# Patient Record
Sex: Female | Born: 1955
Health system: Southern US, Community
[De-identification: ages and names within clinical notes are randomized; demographics above are authoritative.]

## PROBLEM LIST (undated history)

## (undated) DIAGNOSIS — E7800 Pure hypercholesterolemia, unspecified: Secondary | ICD-10-CM

## (undated) DIAGNOSIS — I10 Essential (primary) hypertension: Secondary | ICD-10-CM

## (undated) DIAGNOSIS — I214 Non-ST elevation (NSTEMI) myocardial infarction: Secondary | ICD-10-CM

## (undated) DIAGNOSIS — E785 Hyperlipidemia, unspecified: Secondary | ICD-10-CM

## (undated) HISTORY — DX: Non-ST elevation (NSTEMI) myocardial infarction: I21.4

## (undated) HISTORY — DX: Essential (primary) hypertension: I10

## (undated) HISTORY — PX: TONSILLECTOMY: SUR1361

## (undated) HISTORY — DX: Hyperlipidemia, unspecified: E78.5

---

## 2000-11-17 ENCOUNTER — Other Ambulatory Visit: Admission: RE | Admit: 2000-11-17 | Discharge: 2000-11-17 | Payer: Self-pay | Admitting: Gynecology

## 2002-02-01 ENCOUNTER — Other Ambulatory Visit: Admission: RE | Admit: 2002-02-01 | Discharge: 2002-02-01 | Payer: Self-pay | Admitting: Gynecology

## 2003-02-07 ENCOUNTER — Other Ambulatory Visit: Admission: RE | Admit: 2003-02-07 | Discharge: 2003-02-07 | Payer: Self-pay | Admitting: Gynecology

## 2003-02-16 ENCOUNTER — Encounter: Payer: Self-pay | Admitting: Gynecology

## 2003-02-16 ENCOUNTER — Encounter: Admission: RE | Admit: 2003-02-16 | Discharge: 2003-02-16 | Payer: Self-pay | Admitting: Gynecology

## 2004-03-05 ENCOUNTER — Other Ambulatory Visit: Admission: RE | Admit: 2004-03-05 | Discharge: 2004-03-05 | Payer: Self-pay | Admitting: Gynecology

## 2004-04-04 ENCOUNTER — Encounter: Admission: RE | Admit: 2004-04-04 | Discharge: 2004-04-04 | Payer: Self-pay | Admitting: Gynecology

## 2005-03-06 ENCOUNTER — Other Ambulatory Visit: Admission: RE | Admit: 2005-03-06 | Discharge: 2005-03-06 | Payer: Self-pay | Admitting: Gynecology

## 2005-09-09 ENCOUNTER — Other Ambulatory Visit: Admission: RE | Admit: 2005-09-09 | Discharge: 2005-09-09 | Payer: Self-pay | Admitting: Gynecology

## 2005-10-07 ENCOUNTER — Encounter: Admission: RE | Admit: 2005-10-07 | Discharge: 2005-10-07 | Payer: Self-pay | Admitting: Gynecology

## 2005-10-22 ENCOUNTER — Encounter: Admission: RE | Admit: 2005-10-22 | Discharge: 2005-10-22 | Payer: Self-pay | Admitting: Gynecology

## 2006-03-11 ENCOUNTER — Other Ambulatory Visit: Admission: RE | Admit: 2006-03-11 | Discharge: 2006-03-11 | Payer: Self-pay | Admitting: Gynecology

## 2006-11-13 ENCOUNTER — Encounter: Admission: RE | Admit: 2006-11-13 | Discharge: 2006-11-13 | Payer: Self-pay | Admitting: Gynecology

## 2008-01-04 ENCOUNTER — Encounter: Admission: RE | Admit: 2008-01-04 | Discharge: 2008-01-04 | Payer: Self-pay | Admitting: Gynecology

## 2009-01-16 ENCOUNTER — Encounter: Admission: RE | Admit: 2009-01-16 | Discharge: 2009-01-16 | Payer: Self-pay | Admitting: Gynecology

## 2010-02-05 ENCOUNTER — Encounter: Admission: RE | Admit: 2010-02-05 | Discharge: 2010-02-05 | Payer: Self-pay | Admitting: Gynecology

## 2011-01-18 ENCOUNTER — Other Ambulatory Visit: Payer: Self-pay | Admitting: Gynecology

## 2011-01-18 DIAGNOSIS — Z1231 Encounter for screening mammogram for malignant neoplasm of breast: Secondary | ICD-10-CM

## 2011-02-11 ENCOUNTER — Ambulatory Visit
Admission: RE | Admit: 2011-02-11 | Discharge: 2011-02-11 | Disposition: A | Discharge status: Home / Self Care | Payer: BC Managed Care – PPO | Source: Ambulatory Visit | Attending: Gynecology | Admitting: Gynecology

## 2011-02-11 DIAGNOSIS — Z1231 Encounter for screening mammogram for malignant neoplasm of breast: Secondary | ICD-10-CM

## 2011-02-19 ENCOUNTER — Other Ambulatory Visit: Payer: Self-pay | Admitting: Gynecology

## 2011-02-19 DIAGNOSIS — R928 Other abnormal and inconclusive findings on diagnostic imaging of breast: Secondary | ICD-10-CM

## 2011-03-01 ENCOUNTER — Ambulatory Visit
Admission: RE | Admit: 2011-03-01 | Discharge: 2011-03-01 | Disposition: A | Discharge status: Home / Self Care | Payer: BC Managed Care – PPO | Source: Ambulatory Visit | Attending: Gynecology | Admitting: Gynecology

## 2011-03-01 DIAGNOSIS — R928 Other abnormal and inconclusive findings on diagnostic imaging of breast: Secondary | ICD-10-CM

## 2011-12-30 ENCOUNTER — Encounter (HOSPITAL_BASED_OUTPATIENT_CLINIC_OR_DEPARTMENT_OTHER): Payer: Self-pay | Admitting: *Deleted

## 2011-12-30 NOTE — Progress Notes (Signed)
To [facility] at  [phone], Ekg on arrival Npo after mn.

## 2012-01-03 ENCOUNTER — Encounter (HOSPITAL_BASED_OUTPATIENT_CLINIC_OR_DEPARTMENT_OTHER): Payer: Self-pay | Admitting: *Deleted

## 2012-01-03 ENCOUNTER — Encounter (HOSPITAL_BASED_OUTPATIENT_CLINIC_OR_DEPARTMENT_OTHER): Payer: Self-pay | Admitting: Anesthesiology

## 2012-01-03 ENCOUNTER — Other Ambulatory Visit: Payer: Self-pay

## 2012-01-03 ENCOUNTER — Encounter (HOSPITAL_BASED_OUTPATIENT_CLINIC_OR_DEPARTMENT_OTHER)
Admission: RE | Disposition: A | Discharge status: Home / Self Care | Payer: Self-pay | Source: Ambulatory Visit | Attending: Gynecology

## 2012-01-03 ENCOUNTER — Ambulatory Visit (HOSPITAL_BASED_OUTPATIENT_CLINIC_OR_DEPARTMENT_OTHER): Payer: BC Managed Care – PPO | Admitting: Anesthesiology

## 2012-01-03 ENCOUNTER — Ambulatory Visit (HOSPITAL_BASED_OUTPATIENT_CLINIC_OR_DEPARTMENT_OTHER)
Admission: RE | Admit: 2012-01-03 | Discharge: 2012-01-03 | Disposition: A | Discharge status: Home / Self Care | Payer: BC Managed Care – PPO | Source: Ambulatory Visit | Attending: Gynecology | Admitting: Gynecology

## 2012-01-03 DIAGNOSIS — N8000 Endometriosis of the uterus, unspecified: Secondary | ICD-10-CM | POA: Insufficient documentation

## 2012-01-03 DIAGNOSIS — N950 Postmenopausal bleeding: Secondary | ICD-10-CM | POA: Insufficient documentation

## 2012-01-03 HISTORY — DX: Essential (primary) hypertension: I10

## 2012-01-03 HISTORY — DX: Pure hypercholesterolemia, unspecified: E78.00

## 2012-01-03 HISTORY — PX: HYSTEROSCOPY WITH D & C: SHX1775

## 2012-01-03 LAB — POCT I-STAT, CHEM 8
BUN: 15 mg/dL (ref 6–23)
Calcium, Ion: 1.28 mmol/L — ABNORMAL HIGH (ref 1.12–1.23)
TCO2: 26 mmol/L (ref 0–100)

## 2012-01-03 SURGERY — DILATATION AND CURETTAGE /HYSTEROSCOPY
Anesthesia: General | Site: Uterus | Wound class: Clean Contaminated

## 2012-01-03 MED ORDER — OXYCODONE-ACETAMINOPHEN 5-325 MG PO TABS: 1.0000 | ORAL_TABLET | ORAL | Status: DC | PRN

## 2012-01-03 MED ORDER — PROMETHAZINE HCL 25 MG RE SUPP
25.0000 mg | Freq: Four times a day (QID) | RECTAL | Status: DC | PRN
  Administered 2012-01-03: 25 mg via RECTAL

## 2012-01-03 MED ORDER — LACTATED RINGERS IV SOLN
INTRAVENOUS | Status: DC
  Administered 2012-01-03 (×2): via INTRAVENOUS

## 2012-01-03 MED ORDER — LIDOCAINE HCL (CARDIAC) 20 MG/ML IV SOLN
INTRAVENOUS | Status: DC | PRN
  Administered 2012-01-03: 50 mg via INTRAVENOUS

## 2012-01-03 MED ORDER — KETOROLAC TROMETHAMINE 30 MG/ML IJ SOLN
30.0000 mg | Freq: Once | INTRAMUSCULAR | Status: AC
  Administered 2012-01-03: 30 mg via INTRAVENOUS

## 2012-01-03 MED ORDER — LIDOCAINE HCL (PF) 1 % IJ SOLN
INTRAMUSCULAR | Status: DC | PRN
  Administered 2012-01-03: 10 mL

## 2012-01-03 MED ORDER — HYDRALAZINE HCL 20 MG/ML IJ SOLN
INTRAMUSCULAR | Status: DC | PRN
  Administered 2012-01-03 (×3): 5 mg via INTRAVENOUS

## 2012-01-03 MED ORDER — PROMETHAZINE HCL 25 MG/ML IJ SOLN
6.2500 mg | INTRAMUSCULAR | Status: DC | PRN
  Administered 2012-01-03: 6.25 mg via INTRAVENOUS

## 2012-01-03 MED ORDER — KETOROLAC TROMETHAMINE 30 MG/ML IJ SOLN
INTRAMUSCULAR | Status: DC | PRN
  Administered 2012-01-03: 30 mg via INTRAVENOUS

## 2012-01-03 MED ORDER — FENTANYL CITRATE 0.05 MG/ML IJ SOLN
INTRAMUSCULAR | Status: DC | PRN
  Administered 2012-01-03: 50 ug via INTRAVENOUS
  Administered 2012-01-03 (×2): 25 ug via INTRAVENOUS

## 2012-01-03 MED ORDER — MIDAZOLAM HCL 5 MG/5ML IJ SOLN
INTRAMUSCULAR | Status: DC | PRN
  Administered 2012-01-03: 2 mg via INTRAVENOUS

## 2012-01-03 MED ORDER — FENTANYL CITRATE 0.05 MG/ML IJ SOLN
25.0000 ug | INTRAMUSCULAR | Status: DC | PRN
  Administered 2012-01-03 (×3): 25 ug via INTRAVENOUS

## 2012-01-03 MED ORDER — GLYCINE 1.5 % IR SOLN
Status: DC | PRN
  Administered 2012-01-03: 2

## 2012-01-03 MED ORDER — PROPOFOL INFUSION 10 MG/ML OPTIME
INTRAVENOUS | Status: DC | PRN
  Administered 2012-01-03: 50 ug/kg/min via INTRAVENOUS

## 2012-01-03 MED ORDER — LACTATED RINGERS IV SOLN: INTRAVENOUS | Status: DC

## 2012-01-03 SURGICAL SUPPLY — 29 items
CANISTER SUCTION 2500CC (MISCELLANEOUS) ×2 IMPLANT
CATH ROBINSON RED A/P 16FR (CATHETERS) ×1 IMPLANT
CLOTH BEACON ORANGE TIMEOUT ST (SAFETY) ×2 IMPLANT
CORD ACTIVE DISPOSABLE (ELECTRODE) ×1
CORD ELECTRO ACTIVE DISP (ELECTRODE) ×1 IMPLANT
COVER TABLE BACK 60X90 (DRAPES) ×2 IMPLANT
DRAPE CAMERA CLOSED 9X96 (DRAPES) ×2 IMPLANT
DRAPE LG THREE QUARTER DISP (DRAPES) ×2 IMPLANT
ELECT LOOP GYNE PRO 24FR (CUTTING LOOP) ×4
ELECT REM PT RETURN 9FT ADLT (ELECTROSURGICAL) ×2
ELECT VAPORTRODE GRVD BAR (ELECTRODE) IMPLANT
ELECTRODE LOOP GYNE PRO 24FR (CUTTING LOOP) ×1 IMPLANT
ELECTRODE REM PT RTRN 9FT ADLT (ELECTROSURGICAL) ×1 IMPLANT
GLOVE BIO SURGEON STRL SZ8 (GLOVE) ×4 IMPLANT
GLOVE INDICATOR 7.0 STRL GRN (GLOVE) ×1 IMPLANT
GLYCINE 1.5% IRRIG UROMATIC (IV SOLUTION) ×2 IMPLANT
GOWN STRL NON-REIN LRG LVL3 (GOWN DISPOSABLE) ×2 IMPLANT
GOWN STRL REIN XL XLG (GOWN DISPOSABLE) ×2 IMPLANT
LEGGING LITHOTOMY PAIR STRL (DRAPES) ×2 IMPLANT
NDL SPNL 22GX3.5 QUINCKE BK (NEEDLE) ×1 IMPLANT
NEEDLE SPNL 22GX3.5 QUINCKE BK (NEEDLE) ×2 IMPLANT
PACK BASIN DAY SURGERY FS (CUSTOM PROCEDURE TRAY) ×2 IMPLANT
PAD OB MATERNITY 4.3X12.25 (PERSONAL CARE ITEMS) ×2 IMPLANT
PAD PREP 24X48 CUFFED NSTRL (MISCELLANEOUS) ×2 IMPLANT
SYR CONTROL 10ML LL (SYRINGE) ×1 IMPLANT
TOWEL OR 17X24 6PK STRL BLUE (TOWEL DISPOSABLE) ×1 IMPLANT
TRAY DSU PREP LF (CUSTOM PROCEDURE TRAY) ×2 IMPLANT
TUBING HYDROFLEX HYSTEROSCOPY (TUBING) ×2 IMPLANT
WATER STERILE IRR 500ML POUR (IV SOLUTION) ×2 IMPLANT

## 2012-01-03 NOTE — Anesthesia Preprocedure Evaluation (Addendum)
Anesthesia Evaluation  Patient identified by MRN, date of birth, ID band Patient awake    Reviewed: Allergy & Precautions, H&P , NPO status , Patient's Chart, lab work & pertinent test results  Airway Mallampati: II TM Distance: >3 FB Neck ROM: Full    Dental  (+) Teeth Intact and Dental Advisory Given   Pulmonary neg pulmonary ROS,  breath sounds clear to auscultation  Pulmonary exam normal       Cardiovascular hypertension, Pt. on medications Rhythm:Regular Rate:Normal     Neuro/Psych negative neurological ROS  negative psych ROS   GI/Hepatic negative GI ROS, Neg liver ROS,   Endo/Other  negative endocrine ROS  Renal/GU negative Renal ROS  negative genitourinary   Musculoskeletal negative musculoskeletal ROS (+)   Abdominal   Peds  Hematology negative hematology ROS (+)   Anesthesia Other Findings   Reproductive/Obstetrics negative OB ROS                           Anesthesia Physical Anesthesia Plan  ASA: II  Anesthesia Plan: General   Post-op Pain Management:    Induction: Intravenous  Airway Management Planned: LMA  Additional Equipment:   Intra-op Plan:   Post-operative Plan: Extubation in OR  Informed Consent: I have reviewed the patients History and Physical, chart, labs and discussed the procedure including the risks, benefits and alternatives for the proposed anesthesia with the patient or authorized representative who has indicated his/her understanding and acceptance.   Dental advisory given  Plan Discussed with: CRNA  Anesthesia Plan Comments:         Anesthesia Quick Evaluation  

## 2012-01-03 NOTE — Anesthesia Postprocedure Evaluation (Signed)
Anesthesia Post Note  Patient: Kristin Stevenson  Procedure(s) Performed: Procedure(s) (LRB): DILATATION AND CURETTAGE /HYSTEROSCOPY (N/A)  Anesthesia type: MAC  Patient location: PACU  Post pain: Pain level controlled  Post assessment: Post-op Vital signs reviewed  Last Vitals:  Filed Vitals:   01/03/12 0957  BP: 153/67  Pulse: 89  Temp:   Resp: 18    Post vital signs: Reviewed  Level of consciousness: sedated  Complications: No apparent anesthesia complications

## 2012-01-03 NOTE — Transfer of Care (Signed)
Immediate Anesthesia Transfer of Care Note  Patient: Kristin Stevenson  Procedure(s) Performed: Procedure(s) (LRB): DILATATION AND CURETTAGE /HYSTEROSCOPY (N/A)  Patient Location: PACU  Anesthesia Type: MAC  Level of Consciousness: awake, alert , oriented and patient cooperative  Airway & Oxygen Therapy: Patient Spontanous Breathing and Patient connected to face mask oxygen  Post-op Assessment: Report given to PACU RN and Post -op Vital signs reviewed and stable  Post vital signs: Reviewed and stable  Complications: No apparent anesthesia complications

## 2012-01-07 ENCOUNTER — Encounter (HOSPITAL_BASED_OUTPATIENT_CLINIC_OR_DEPARTMENT_OTHER): Payer: Self-pay | Admitting: Gynecology

## 2012-01-07 NOTE — Op Note (Signed)
Kristin Stevenson, Kristin Stevenson                ACCOUNT NO.:  [id number]  MEDICAL RECORD NO.:  [id number]  LOCATION:                                 FACILITY:  PHYSICIAN:  Gretta Cool, M.D. DATE OF BIRTH:  [birth year]  DATE OF PROCEDURE:  01/03/2012 DATE OF DISCHARGE:                              OPERATIVE REPORT   PREOPERATIVE DIAGNOSES:  Postmenopausal bleeding with abnormal endometrial thickening and saline ultrasound suspicious of endometrial polyp versus fibroid bulging into the cavity.  POSTOPERATIVE DIAGNOSIS:  Postmenopausal bleeding with abnormal endometrial thickening and saline ultrasound suspicious of endometrial polyp versus fibroid bulging into the cavity.  PROCEDURE:  Hysteroscopy, resection of polypoid endometrial tissue.  SURGEON:  Gretta Cool, M.D.  ANESTHESIA:  IV sedation and paracervical block.  DESCRIPTION OF PROCEDURE:  Under excellent anesthesia as above, with the patient prepped and draped in Allen stirrups.  Bladder drained. Weighted speculum inserted in the vagina.  The cervix was grasped and a paracervical block applied.  After excellent paracervical block, the cervix progressively dilated with series of Pratt dilators to accommodate 7-mm resectoscope.  Resectoscope was then introduced and the cavity examined.  There was no evidence of fibroid, but there was a very shaggy endometrial tissue in the fundal area.  The tissue was resected totally and submitted for pathologic exam.  Once all of the endometrial tissue was removed, insofar as any significant hyperplastic looking areas, the bleeding was well-controlled at reduced pressure.  The patient was then returned to the recovery room in excellent condition with no significant fluid deficit and no complications.          ______________________________ Gretta Cool, M.D.     CWL/MEDQ  D:  01/03/2012  T:  01/04/2012  Job:  [id number]  cc:   Jani Files

## 2012-06-08 ENCOUNTER — Other Ambulatory Visit: Payer: Self-pay | Admitting: Gynecology

## 2014-11-14 ENCOUNTER — Encounter: Payer: Self-pay | Admitting: Podiatry

## 2014-11-14 ENCOUNTER — Ambulatory Visit: Payer: No Typology Code available for payment source | Admitting: Podiatry

## 2014-11-14 ENCOUNTER — Ambulatory Visit (INDEPENDENT_AMBULATORY_CARE_PROVIDER_SITE_OTHER): Payer: No Typology Code available for payment source

## 2014-11-14 VITALS — BP 160/115 | HR 95 | Resp 18

## 2014-11-14 DIAGNOSIS — M722 Plantar fascial fibromatosis: Secondary | ICD-10-CM

## 2014-11-14 NOTE — Progress Notes (Signed)
   Subjective:    Patient ID: Kristin Stevenson, female    DOB: 04/08/1956, 59 y.o.   MRN: [id number]  HPI MY LEFT HEEL FEELS LIKE A STONE BRUISE AND HAS BEEN GOING ON SINCE April 2016 AND I DID HAVE BURSITIS IN MY LEFT HIP AND I ASKED MY FAMILY DOCTOR AND SHE STATED IT COULD BE MY ACHILLES TENDON AND HURTS ON THE BOTTOM AND BACK OF MY LEFT HEEL AND HURTS AFTER I SIT AND HURTS IN THE AM This patient presents with pain in her left heel and she points to the bottom of her left feel.  She gives history of hip pain requiring therapy.  She was given stretching exercises for her foot but the pain persists.  She says she experiences stiffness to the bottom of heel upon rising and pain after a days activity. She says her pain is about a five and she wants this to be better before her vacation.   Review of Systems  All other systems reviewed and are negative.      Objective:   Physical Exam Objective: Review of past medical history, medications, social history and allergies were performed.  Vascular: Dorsalis pedis and posterior tibial pulses were palpable B/L, capillary refill was  WNL B/L, temperature gradient was WNL B/L   Skin:  No signs of symptoms of infection or ulcers on both feet  Nails: appear healthy with no signs of mycosis or infections  Sensory: Semmes Weinstein monifilament WNL   Orthopedic: Orthopedic evaluation demonstrates all joints distal t ankle have full ROM without crepitus, muscle power WNL B/L.  Palpable pain at insertion plantar fascia left heel.        Assessment & Plan:  Plantar Fascitis left foot.  IE.  X-ray taken.  Prescribed Mobic.  Powersteps dispensed.

## 2014-11-15 MED ORDER — MELOXICAM 15 MG PO TABS
15.0000 mg | ORAL_TABLET | Freq: Every day | ORAL | Status: DC
Start: 2014-11-15 — End: 2017-02-27

## 2014-11-15 NOTE — Addendum Note (Signed)
Addended by: Cranford Mon R on: 11/15/2014 08:19 AM   Modules accepted: Orders

## 2015-01-31 DIAGNOSIS — I10 Essential (primary) hypertension: Secondary | ICD-10-CM

## 2015-01-31 DIAGNOSIS — E785 Hyperlipidemia, unspecified: Secondary | ICD-10-CM | POA: Insufficient documentation

## 2015-01-31 DIAGNOSIS — I214 Non-ST elevation (NSTEMI) myocardial infarction: Secondary | ICD-10-CM | POA: Insufficient documentation

## 2015-01-31 HISTORY — DX: Hyperlipidemia, unspecified: E78.5

## 2015-01-31 HISTORY — DX: Essential (primary) hypertension: I10

## 2015-01-31 HISTORY — DX: Non-ST elevation (NSTEMI) myocardial infarction: I21.4

## 2016-05-27 HISTORY — PX: COLONOSCOPY: SHX174

## 2016-07-31 DIAGNOSIS — N951 Menopausal and female climacteric states: Secondary | ICD-10-CM

## 2016-07-31 HISTORY — DX: Menopausal and female climacteric states: N95.1

## 2017-02-25 ENCOUNTER — Other Ambulatory Visit: Payer: Self-pay | Admitting: *Deleted

## 2017-02-27 ENCOUNTER — Encounter: Payer: Self-pay | Admitting: Cardiology

## 2017-02-27 ENCOUNTER — Ambulatory Visit (INDEPENDENT_AMBULATORY_CARE_PROVIDER_SITE_OTHER): Payer: BLUE CROSS/BLUE SHIELD | Admitting: Cardiology

## 2017-02-27 VITALS — BP 120/70 | HR 101 | Ht 64.0 in | Wt 188.0 lb

## 2017-02-27 DIAGNOSIS — I201 Angina pectoris with documented spasm: Secondary | ICD-10-CM | POA: Diagnosis not present

## 2017-02-27 DIAGNOSIS — I10 Essential (primary) hypertension: Secondary | ICD-10-CM

## 2017-02-27 DIAGNOSIS — E785 Hyperlipidemia, unspecified: Secondary | ICD-10-CM | POA: Diagnosis not present

## 2017-02-27 HISTORY — DX: Angina pectoris with documented spasm: I20.1

## 2017-02-27 MED ORDER — NITROGLYCERIN 0.4 MG SL SUBL: 0.4000 mg | SUBLINGUAL_TABLET | SUBLINGUAL | 2 refills | Status: DC | PRN

## 2017-02-27 MED ORDER — AMLODIPINE BESYLATE 5 MG PO TABS: 5.0000 mg | ORAL_TABLET | Freq: Every day | ORAL | 3 refills | Status: DC

## 2017-02-27 NOTE — Progress Notes (Signed)
Cardiology Office Note:    Date:  02/27/2017   ID:  Kristin Stevenson, DOB 08-Oct-1955, MRN [id number]  PCP:  Marco Collie, MD  Cardiologist:  Shirlee More, MD    Referring MD: Marco Collie, MD    ASSESSMENT:    1. Coronary artery vasospasm ([place])   2. Essential hypertension   3. Dyslipidemia    PLAN:    In order of problems listed above:  1. Stable she is not having typical angina and has not required nitroglycerin statin and long-acting calcium channel blocker.  At this time I do not think she requires an ischemia evaluation.  If having frequent angina she will contact me  2          Stable blood pressure at target continue current combination ACE long-acting calcium channel blocker 3            stable continue her statin recent lipids and CMP requested   Next appointment: One year   Medication Adjustments/Labs and Tests Ordered: Current medicines are reviewed at length with the patient today.  Concerns regarding medicines are outlined above.  Orders Placed This Encounter  Procedures  . EKG 12-Lead   No orders of the defined types were placed in this encounter.   Chief Complaint  Patient presents with  . Follow-up    doing well   . Annual Exam    History of Present Illness:    Kristin Stevenson is a 61 y.o. female with a hx of coronary vasospasm andNSTEMI in Oct 2015 with normal coronary arteriography last seen one year ago. Compliance with diet, lifestyle and medications: Yes She is pleased with the quality of her life at times has fleeting very brief chest discomfort but has not had angina or required nitroglycerin.  Is been no shortness of breath palpitation syncope or TIA. Past Medical History:  Diagnosis Date  . Dyslipidemia 01/31/2015  . Essential hypertension 01/31/2015  . Hypercholesteremia   . Hypertension   . Non-STEMI (non-ST elevated myocardial infarction) ([place]) 01/31/2015   Overview:  Cardiac cath 02/19/14 with normal coronary arteriography mild apical  hypokinesia EF 60%    Past Surgical History:  Procedure Laterality Date  . CESAREAN SECTION  1981  . HYSTEROSCOPY W/D&C  01/03/2012   Procedure: DILATATION AND CURETTAGE /HYSTEROSCOPY;  Surgeon: Selinda Orion, MD;  Location: [facility];  Service: Gynecology;  Laterality: N/A;  WITH RESECTION OF FIBROIDS     . REPEAT CESAREAN SECTION  1983  . TONSILLECTOMY      Current Medications: Current Meds  Medication Sig  . amLODipine (NORVASC) 5 MG tablet TK 1 T PO  QD  . aspirin 81 MG tablet Take 81 mg by mouth daily.  Marland Kitchen BRISDELLE 7.5 MG CAPS Take 7.5 mg by mouth daily.   . Cholecalciferol (VITAMIN D-1000 MAX ST) 1000 units tablet Take 1,000 Units by mouth daily.   Marland Kitchen Co-Enzyme Q-10 30 MG CAPS Take 30 mg by mouth daily.  . DOCOSAHEXAENOIC ACID PO Take 1,000 mg by mouth daily.   Marland Kitchen estradiol (ESTRACE) 0.1 MG/GM vaginal cream Place 1 Applicatorful vaginally daily as needed.  Marland Kitchen lisinopril (PRINIVIL,ZESTRIL) 20 MG tablet Take 20 mg by mouth.  . Multiple Vitamin (MULTIVITAMIN) tablet Take 1 tablet by mouth daily.  . nitroGLYCERIN (NITROSTAT) 0.4 MG SL tablet Place 0.4 mg under the tongue every 5 (five) hours as needed.  . simvastatin (ZOCOR) 20 MG tablet Take 20 mg by mouth every evening.     Allergies:  Patient has no known allergies.   Social History   Social History  . Marital status: Unknown    Spouse name: N/A  . Number of children: N/A  . Years of education: N/A   Social History Main Topics  . Smoking status: Never Smoker  . Smokeless tobacco: Never Used  . Alcohol use Yes     Comment: occasional  . Drug use: No  . Sexual activity: Not Asked   Other Topics Concern  . None   Social History Narrative  . None     Family History: The patient's family history includes Diabetes in her mother; Hypertension in her mother and paternal uncle. ROS:   Please see the history of present illness.    All other systems reviewed and are negative.  EKGs/Labs/Other  Studies Reviewed:    The following studies were reviewed today:  EKG:  EKG ordered today.  The ekg ordered today demonstrates [place] and is normal  Recent Labs: No results found for requested labs within last 8760 hours.  Recent Lipid Panel No results found for: CHOL, TRIG, HDL, CHOLHDL, VLDL, LDLCALC, LDLDIRECT  Physical Exam:    VS:  BP 120/70   Pulse (!) 101   Ht 5\' 4"  (1.626 m)   Wt 188 lb (85.3 kg)   SpO2 96%   BMI 32.27 kg/m     Wt Readings from Last 3 Encounters:  02/27/17 188 lb (85.3 kg)  01/03/12 160 lb (72.6 kg)     GEN:  Well nourished, well developed in no acute distress HEENT: Normal NECK: No JVD; No carotid bruits LYMPHATICS: No lymphadenopathy CARDIAC: RRR, no murmurs, rubs, gallops RESPIRATORY:  Clear to auscultation without rales, wheezing or rhonchi  ABDOMEN: Soft, non-tender, non-distended MUSCULOSKELETAL:  No edema; No deformity  SKIN: Warm and dry NEUROLOGIC:  Alert and oriented x 3 PSYCHIATRIC:  Normal affect    Signed, Shirlee More, MD  02/27/2017 4:38 PM    [facility]

## 2017-02-27 NOTE — Patient Instructions (Signed)

## 2018-02-24 NOTE — Progress Notes (Signed)
Cardiology Office Note:    Date:  02/25/2018   ID:  Kristin Stevenson, DOB [birth year], MRN [id number]  PCP:  Marco Collie, MD  Cardiologist:  Shirlee More, MD    Referring MD: Marco Collie, MD    ASSESSMENT:    1. Coronary artery vasospasm ([place])   2. Essential hypertension   3. Dyslipidemia    PLAN:    In order of problems listed above:  1. Her course is stable rarely she gets brief chest discomfort relieved with nitroglycerin.  Is not exertional is related to work and stress in her life and she will continue current medical treatment aspirin calcium channel blocker statin and she has frequent episodes we could place her on oral Imdur. 2. Stable blood pressure target continue current treatment calcium channel blockers ideal 3. Stable continue her statin.  Last lipid profile AC from February of this year had a LDL of 63 HDL 74 cholesterol 173   Next appointment: One year   Medication Adjustments/Labs and Tests Ordered: Current medicines are reviewed at length with the patient today.  Concerns regarding medicines are outlined above.  No orders of the defined types were placed in this encounter.  No orders of the defined types were placed in this encounter.   Chief Complaint  Patient presents with  . Follow-up    coronary artery spasm    History of Present Illness:    Kristin Stevenson is a 62 y.o. female with a hx of  coronary vasospasm andNSTEMI in Oct 2015 with normal coronary arteriography  last seen 02/27/17. Compliance with diet, lifestyle and medications: Yes  In general has done well has rare episodes of brief chest discomfort relieved with the nitroglycerin is not exertional in nature.  She is in a lifestyle program exercise and weight loss and is pleased.  No shortness of breath palpitation or syncope Past Medical History:  Diagnosis Date  . Dyslipidemia 01/31/2015  . Essential hypertension 01/31/2015  . Hypercholesteremia   . Hypertension   . Non-STEMI (non-ST  elevated myocardial infarction) ([place]) 01/31/2015   Overview:  Cardiac cath 02/19/14 with normal coronary arteriography mild apical hypokinesia EF 60%    Past Surgical History:  Procedure Laterality Date  . CESAREAN SECTION  1981  . HYSTEROSCOPY W/D&C  01/03/2012   Procedure: DILATATION AND CURETTAGE /HYSTEROSCOPY;  Surgeon: Selinda Orion, MD;  Location: [facility];  Service: Gynecology;  Laterality: N/A;  WITH RESECTION OF FIBROIDS     . REPEAT CESAREAN SECTION  1983  . TONSILLECTOMY      Current Medications: Current Meds  Medication Sig  . amLODipine (NORVASC) 5 MG tablet Take 1 tablet (5 mg total) by mouth daily.  Marland Kitchen aspirin 81 MG tablet Take 81 mg by mouth daily.  Marland Kitchen BRISDELLE 7.5 MG CAPS Take 7.5 mg by mouth daily.   . Cholecalciferol (VITAMIN D-1000 MAX ST) 1000 units tablet Take 1,000 Units by mouth daily.   Marland Kitchen Co-Enzyme Q-10 100 MG CAPS Take 100 mg by mouth daily.   . DOCOSAHEXAENOIC ACID PO Take 1,000 mg by mouth daily.   Marland Kitchen estradiol (ESTRACE) 0.1 MG/GM vaginal cream Place 1 Applicatorful vaginally daily as needed.  Marland Kitchen lisinopril (PRINIVIL,ZESTRIL) 20 MG tablet Take 20 mg by mouth.  . Multiple Vitamin (MULTIVITAMIN) tablet Take 1 tablet by mouth daily.  . nitroGLYCERIN (NITROSTAT) 0.4 MG SL tablet Place 1 tablet (0.4 mg total) under the tongue every 5 (five) minutes as needed.  . simvastatin (ZOCOR) 20 MG tablet Take 20  mg by mouth every evening.     Allergies:   Patient has no known allergies.   Social History   Socioeconomic History  . Marital status: Unknown    Spouse name: Not on file  . Number of children: Not on file  . Years of education: Not on file  . Highest education level: Not on file  Occupational History  . Not on file  Social Needs  . Financial resource strain: Not on file  . Food insecurity:    Worry: Not on file    Inability: Not on file  . Transportation needs:    Medical: Not on file    Non-medical: Not on file  Tobacco Use    . Smoking status: Never Smoker  . Smokeless tobacco: Never Used  Substance and Sexual Activity  . Alcohol use: Yes    Comment: occasional  . Drug use: No  . Sexual activity: Not on file  Lifestyle  . Physical activity:    Days per week: Not on file    Minutes per session: Not on file  . Stress: Not on file  Relationships  . Social connections:    Talks on phone: Not on file    Gets together: Not on file    Attends religious service: Not on file    Active member of club or organization: Not on file    Attends meetings of clubs or organizations: Not on file    Relationship status: Not on file  Other Topics Concern  . Not on file  Social History Narrative  . Not on file     Family History: The patient's family history includes Diabetes in her mother; Hypertension in her mother and paternal uncle. ROS:   Please see the history of present illness.    All other systems reviewed and are negative.  EKGs/Labs/Other Studies Reviewed:    The following studies were reviewed today:  EKG:  EKG ordered today.  The ekg ordered today demonstrates [facility] possible septal infarct  Recent Labs: No results found for requested labs within last 8760 hours.  Recent Lipid Panel No results found for: CHOL, TRIG, HDL, CHOLHDL, VLDL, LDLCALC, LDLDIRECT  Physical Exam:    VS:  BP 120/82 (BP Location: Right Arm, Patient Position: Sitting, Cuff Size: Normal)   Pulse 62   Ht 5\' 4"  (1.626 m)   Wt 178 lb 3.2 oz (80.8 kg)   SpO2 97%   BMI 30.59 kg/m     Wt Readings from Last 3 Encounters:  02/25/18 178 lb 3.2 oz (80.8 kg)  02/27/17 188 lb (85.3 kg)  01/03/12 160 lb (72.6 kg)     GEN:  Well nourished, well developed in no acute distress HEENT: Normal NECK: No JVD; No carotid bruits LYMPHATICS: No lymphadenopathy CARDIAC: RRR, no murmurs, rubs, gallops RESPIRATORY:  Clear to auscultation without rales, wheezing or rhonchi  ABDOMEN: Soft, non-tender, non-distended MUSCULOSKELETAL:  No  edema; No deformity  SKIN: Warm and dry NEUROLOGIC:  Alert and oriented x 3 PSYCHIATRIC:  Normal affect    Signed, Shirlee More, MD  02/25/2018 10:19 AM    [place]

## 2018-02-25 ENCOUNTER — Ambulatory Visit: Payer: BLUE CROSS/BLUE SHIELD | Admitting: Cardiology

## 2018-02-25 VITALS — BP 120/82 | HR 62 | Ht 64.0 in | Wt 178.2 lb

## 2018-02-25 DIAGNOSIS — E785 Hyperlipidemia, unspecified: Secondary | ICD-10-CM

## 2018-02-25 DIAGNOSIS — I201 Angina pectoris with documented spasm: Secondary | ICD-10-CM

## 2018-02-25 DIAGNOSIS — I10 Essential (primary) hypertension: Secondary | ICD-10-CM

## 2018-02-25 MED ORDER — NITROGLYCERIN 0.4 MG SL SUBL: 0.4000 mg | SUBLINGUAL_TABLET | SUBLINGUAL | 11 refills | Status: DC | PRN

## 2018-02-25 NOTE — Patient Instructions (Signed)

## 2018-03-31 ENCOUNTER — Other Ambulatory Visit: Payer: Self-pay | Admitting: Cardiology

## 2018-08-22 ENCOUNTER — Other Ambulatory Visit: Payer: Self-pay | Admitting: Cardiology

## 2018-08-24 NOTE — Telephone Encounter (Signed)
Amlodipine sent to [facility] in [place]

## 2018-10-21 ENCOUNTER — Encounter: Payer: Self-pay | Admitting: Sports Medicine

## 2018-10-21 ENCOUNTER — Ambulatory Visit (INDEPENDENT_AMBULATORY_CARE_PROVIDER_SITE_OTHER): Payer: BC Managed Care – PPO

## 2018-10-21 ENCOUNTER — Ambulatory Visit: Payer: BC Managed Care – PPO | Admitting: Sports Medicine

## 2018-10-21 ENCOUNTER — Other Ambulatory Visit: Payer: Self-pay

## 2018-10-21 VITALS — BP 120/71 | HR 66 | Temp 98.0°F | Resp 16

## 2018-10-21 DIAGNOSIS — M79672 Pain in left foot: Secondary | ICD-10-CM

## 2018-10-21 DIAGNOSIS — M2042 Other hammer toe(s) (acquired), left foot: Secondary | ICD-10-CM | POA: Diagnosis not present

## 2018-10-21 NOTE — Patient Instructions (Signed)
Hammer Toe  Hammer toe is a change in the shape (a deformity) of your toe. The deformity causes the middle joint of your toe to stay bent. This causes pain, especially when you are wearing shoes. Hammer toe starts gradually. At first, the toe can be straightened. Gradually over time, the deformity becomes stiff and permanent. Early treatments to keep the toe straight may relieve pain. As the deformity becomes stiff and permanent, surgery may be needed to straighten the toe. What are the causes? Hammer toe is caused by abnormal bending of the toe joint that is closest to your foot. It happens gradually over time. This pulls on the muscles and connections (tendons) of the toe joint, making them weak and stiff. It is often related to wearing shoes that are too short or narrow and do not let your toes straighten. What increases the risk? You may be at greater risk for hammer toe if you:  Are female.  Are older.  Wear shoes that are too small.  Wear high-heeled shoes that pinch your toes.  Are a ballet dancer.  Have a second toe that is longer than your big toe (first toe).  Injure your foot or toe.  Have arthritis.  Have a family history of hammer toe.  Have a nerve or muscle disorder. What are the signs or symptoms? The main symptoms of this condition are pain and deformity of the toe. The pain is worse when wearing shoes, walking, or running. Other symptoms may include:  Corns or calluses over the bent part of the toe or between the toes.  Redness and a burning feeling on the toe.  An open sore that forms on the top of the toe.  Not being able to straighten the toe. How is this diagnosed? This condition is diagnosed based on your symptoms and a physical exam. During the exam, your health care provider will try to straighten your toe to see how stiff the deformity is. You may also have tests, such as:  A blood test to check for rheumatoid arthritis.  An X-ray to show how  severe the deformity is. How is this treated? Treatment for this condition will depend on how stiff the deformity is. Surgery is often needed. However, sometimes a hammer toe can be straightened without surgery. Treatments that do not involve surgery include:  Taping the toe into a straightened position.  Using pads and cushions to protect the toe (orthotics).  Wearing shoes that provide enough room for the toes.  Doing toe-stretching exercises at home.  Taking an NSAID to reduce pain and swelling. If these treatments do not help or the toe cannot be straightened, surgery is the next option. The most common surgeries used to straighten a hammer toe include:  Arthroplasty. In this procedure, part of the joint is removed, and that allows the toe to straighten.  Fusion. In this procedure, cartilage between the two bones of the joint is taken out and the bones are fused together into one longer bone.  Implantation. In this procedure, part of the bone is removed and replaced with an implant to let the toe move again.  Flexor tendon transfer. In this procedure, the tendons that curl the toes down (flexor tendons) are repositioned. Follow these instructions at home:  Take over-the-counter and prescription medicines only as told by your health care provider.  Do toe straightening and stretching exercises as told by your health care provider.  Keep all follow-up visits as told by your health care   provider. This is important. How is this prevented?  Wear shoes that give your toes enough room and do not cause pain.  Do not wear high-heeled shoes. Contact a health care provider if:  Your pain gets worse.  Your toe becomes red or swollen.  You develop an open sore on your toe. This information is not intended to replace advice given to you by your health care provider. Make sure you discuss any questions you have with your health care provider. Document Released: 04/19/2000 Document  Revised: 11/18/2016 Document Reviewed: 08/16/2015 Elsevier Interactive Patient Education  2019 [employer]  

## 2018-10-21 NOTE — Progress Notes (Signed)
Subjective: Kristin Stevenson is a 63 y.o. female patient who presents to office for evaluation of left second toe.  Patient reports that over the last 6 months her left second toe has become more irritated worse with foods.  Patient reports that she noticed after taking off her toe ring that the toe was sitting funny and had a little bump over the top pain at worst is 1 out of 10 and patient reports that she just wants the toe check to see if there is anything more she needs to do at this time.  Patient has a family history of hammertoes and arthritis but denies any other symptoms.  Patient denies any other pedal complaints at this time.   Review of Systems  All other systems reviewed and are negative.    Patient Active Problem List   Diagnosis Date Noted  . Coronary artery vasospasm ([place]) 02/27/2017  . Menopausal symptom 07/31/2016  . Dyslipidemia 01/31/2015  . Essential hypertension 01/31/2015  . Non-STEMI (non-ST elevated myocardial infarction) ([place]) 01/31/2015    Current Outpatient Medications on File Prior to Visit  Medication Sig Dispense Refill  . amLODipine (NORVASC) 5 MG tablet Take 1 tablet by mouth once daily 90 tablet 1  . aspirin 81 MG tablet Take 81 mg by mouth daily.    Marland Kitchen BRISDELLE 7.5 MG CAPS Take 7.5 mg by mouth daily.     . Cholecalciferol (VITAMIN D-1000 MAX ST) 1000 units tablet Take 1,000 Units by mouth daily.     Marland Kitchen Co-Enzyme Q-10 100 MG CAPS Take 100 mg by mouth daily.     Marland Kitchen lisinopril (PRINIVIL,ZESTRIL) 20 MG tablet Take 20 mg by mouth.    . Multiple Vitamin (MULTIVITAMIN) tablet Take 1 tablet by mouth daily.    . nitroGLYCERIN (NITROSTAT) 0.4 MG SL tablet Place 1 tablet (0.4 mg total) under the tongue every 5 (five) minutes as needed. 25 tablet 11  . simvastatin (ZOCOR) 20 MG tablet Take 20 mg by mouth every evening.     No current facility-administered medications on file prior to visit.     No Known Allergies  Objective:  General: Alert and oriented x3 in no  acute distress  Dermatology: Blanchable erythema over the interphalangeal joint of the left second toe.  There is no opening to the left second toe, no webspace macerations, no ecchymosis bilateral, all nails x 10 are well manicured.  Vascular: Dorsalis Pedis and Posterior Tibial pedal pulses 2/4, Capillary Fill Time 3 seconds,(+) pedal hair growth bilateral, no edema bilateral lower extremities, Temperature gradient within normal limits.  Neurology: Johney Maine sensation intact via light touch bilateral.  Musculoskeletal: Semi-flexible left second hammertoe with no tenderness to palpation at the proximal interphalangeal joint.  Ankle, Subtalar, Midtarsal, and MTPJ joint range of motion is within normal limits, there is no 1st ray hypermobility noted bilateral, No bunion deformity noted bilateral. No pain with calf compression bilateral.  Strength within normal limits in all groups bilateral.   Gait: Unassisted, Non-antalgic.  Xrays  Left foot there is of note digital contracture with the left second toe most involved, normal osseous mineralization, no other acute findings.   Impression:       Assessment and Plan: Problem List Items Addressed This Visit    None    Visit Diagnoses    Left foot pain    -  Primary   Relevant Orders   DG Foot Complete Left   Hammertoe of left foot           -  Complete examination performed -Xrays reviewed -Discussed treatement options for hammertoe with irritation -Rx crest toe pad to use as instructed when in shoes that could rub or irritate the toe -Recommend good supportive shoes that are wide enough that do not rub -Advised patient if conservative care fails and toe becomes more painful or a keratotic lesion develops may benefit from hammertoe surgery in the future -Patient to return to office as needed or sooner if condition worsens.  Landis Martins, DPM

## 2018-10-23 ENCOUNTER — Other Ambulatory Visit: Payer: Self-pay | Admitting: Sports Medicine

## 2018-10-23 DIAGNOSIS — M2042 Other hammer toe(s) (acquired), left foot: Secondary | ICD-10-CM

## 2018-10-23 DIAGNOSIS — M79672 Pain in left foot: Secondary | ICD-10-CM

## 2018-12-20 ENCOUNTER — Other Ambulatory Visit: Payer: Self-pay | Admitting: Cardiology

## 2019-03-18 ENCOUNTER — Encounter: Payer: Self-pay | Admitting: Cardiology

## 2019-03-18 ENCOUNTER — Ambulatory Visit (INDEPENDENT_AMBULATORY_CARE_PROVIDER_SITE_OTHER): Payer: BC Managed Care – PPO | Admitting: Cardiology

## 2019-03-18 ENCOUNTER — Other Ambulatory Visit: Payer: Self-pay

## 2019-03-18 VITALS — BP 122/82 | HR 68 | Ht 64.0 in | Wt 179.0 lb

## 2019-03-18 DIAGNOSIS — I201 Angina pectoris with documented spasm: Secondary | ICD-10-CM

## 2019-03-18 DIAGNOSIS — E785 Hyperlipidemia, unspecified: Secondary | ICD-10-CM | POA: Diagnosis not present

## 2019-03-18 DIAGNOSIS — I10 Essential (primary) hypertension: Secondary | ICD-10-CM | POA: Diagnosis not present

## 2019-03-18 MED ORDER — NITROGLYCERIN 0.4 MG SL SUBL: 0.4000 mg | SUBLINGUAL_TABLET | SUBLINGUAL | 11 refills | Status: DC | PRN

## 2019-03-18 NOTE — Patient Instructions (Signed)
Medication Instructions:  Your physician recommends that you continue on your current medications as directed. Please refer to the Current Medication list given to you today.  *If you need a refill on your cardiac medications before your next appointment, please call your pharmacy*  Lab Work: None  If you have labs (blood work) drawn today and your tests are completely normal, you will receive your results only by: . MyChart Message (if you have MyChart) OR . A paper copy in the mail If you have any lab test that is abnormal or we need to change your treatment, we will call you to review the results.  Testing/Procedures: You had an EKG today.   Follow-Up: At [facility], you and your health needs are our priority.  As part of our continuing mission to provide you with exceptional heart care, we have created designated Provider Care Teams.  These Care Teams include your primary Cardiologist (physician) and Advanced Practice Providers (APPs -  Physician Assistants and Nurse Practitioners) who all work together to provide you with the care you need, when you need it.  Your next appointment:   12 months  The format for your next appointment:   In Person  Provider:   Brian Munley, MD   

## 2019-03-18 NOTE — Progress Notes (Signed)
Cardiology Office Note:    Date:  03/18/2019   ID:  Kristin Stevenson, DOB [birth year], MRN [id number]  PCP:  Marco Collie, MD  Cardiologist:  Shirlee More, MD    Referring MD: Marco Collie, MD    ASSESSMENT:    1. Coronary artery vasospasm ([place])   2. Dyslipidemia   3. Essential hypertension    PLAN:    In order of problems listed above:  1. Stable symptoms are well controlled with aspirin calcium channel blocker statin and as needed nitroglycerin.  At this time I would not do any ischemia evaluation I have asked her to be sure that she has new prescription for nitroglycerin to avoid ED visits plan to see in 1 year or sooner if she has a change in clinical status.  Presently New York Heart Association class I 2. Hypertension is stable BP at target continue combination amlodipine lisinopril and will try to access labs to look at renal function potassium follow through Dr. Nyra Capes 3. To new statin goal LDL less than 70 she has arrangements for labs to be performed in the next month managed by her PCP   Next appointment: Year or sooner for change in clinical status   Medication Adjustments/Labs and Tests Ordered: Current medicines are reviewed at length with the patient today.  Concerns regarding medicines are outlined above.  Orders Placed This Encounter  Procedures  . EKG 12-Lead   Meds ordered this encounter  Medications  . nitroGLYCERIN (NITROSTAT) 0.4 MG SL tablet    Sig: Place 1 tablet (0.4 mg total) under the tongue every 5 (five) minutes as needed.    Dispense:  25 tablet    Refill:  11    Chief Complaint  Patient presents with  . Follow-up    coronary artery spasm    History of Present Illness:    Kristin Stevenson is a 63 y.o. female with a hx of coronary vasospasm and NSTEMI in Oct 2015 with normal coronary arteriography and apical hypokinesia who was  last seen 02/25/2018. Compliance with diet, lifestyle and medications: Yes  Been very stressful time for her she  has had some episodes of brief momentary chest discomfort stress always relieved by stopping for a moment has not needed nitroglycerin.  She request a new copy.  Lab work was done her PCP office will try to access this she continues on her same statin and has arrangements to repeat it next month.  She is having no muscle symptoms of weakness or pain and has had no shortness of breath palpitation or syncope Past Medical History:  Diagnosis Date  . Dyslipidemia 01/31/2015  . Essential hypertension 01/31/2015  . Hypercholesteremia   . Hypertension   . Non-STEMI (non-ST elevated myocardial infarction) ([place]) 01/31/2015   Overview:  Cardiac cath 02/19/14 with normal coronary arteriography mild apical hypokinesia EF 60%    Past Surgical History:  Procedure Laterality Date  . CESAREAN SECTION  1981  . HYSTEROSCOPY W/D&C  01/03/2012   Procedure: DILATATION AND CURETTAGE /HYSTEROSCOPY;  Surgeon: Selinda Orion, MD;  Location: [facility];  Service: Gynecology;  Laterality: N/A;  WITH RESECTION OF FIBROIDS     . REPEAT CESAREAN SECTION  1983  . TONSILLECTOMY      Current Medications: Current Meds  Medication Sig  . amLODipine (NORVASC) 5 MG tablet Take 1 tablet (5 mg total) by mouth daily. NEED OFFICE VISIT FOR MORE REFILLS  . aspirin 81 MG tablet Take 81 mg by mouth daily.  Marland Kitchen  BRISDELLE 7.5 MG CAPS Take 7.5 mg by mouth daily.   . Cholecalciferol (VITAMIN D-1000 MAX ST) 1000 units tablet Take 1,000 Units by mouth daily.   Marland Kitchen Co-Enzyme Q-10 100 MG CAPS Take 100 mg by mouth daily.   Marland Kitchen lisinopril (PRINIVIL,ZESTRIL) 20 MG tablet Take 20 mg by mouth daily.   . Multiple Vitamin (MULTIVITAMIN) tablet Take 1 tablet by mouth daily.  . nitroGLYCERIN (NITROSTAT) 0.4 MG SL tablet Place 1 tablet (0.4 mg total) under the tongue every 5 (five) minutes as needed.  . simvastatin (ZOCOR) 20 MG tablet Take 20 mg by mouth every evening.  . [DISCONTINUED] nitroGLYCERIN (NITROSTAT) 0.4 MG SL tablet Place  1 tablet (0.4 mg total) under the tongue every 5 (five) minutes as needed.     Allergies:   Patient has no known allergies.   Social History   Socioeconomic History  . Marital status: Unknown    Spouse name: Not on file  . Number of children: Not on file  . Years of education: Not on file  . Highest education level: Not on file  Occupational History  . Not on file  Social Needs  . Financial resource strain: Not on file  . Food insecurity    Worry: Not on file    Inability: Not on file  . Transportation needs    Medical: Not on file    Non-medical: Not on file  Tobacco Use  . Smoking status: Never Smoker  . Smokeless tobacco: Never Used  Substance and Sexual Activity  . Alcohol use: Yes    Comment: occasional  . Drug use: No  . Sexual activity: Not on file  Lifestyle  . Physical activity    Days per week: Not on file    Minutes per session: Not on file  . Stress: Not on file  Relationships  . Social Herbalist on phone: Not on file    Gets together: Not on file    Attends religious service: Not on file    Active member of club or organization: Not on file    Attends meetings of clubs or organizations: Not on file    Relationship status: Not on file  Other Topics Concern  . Not on file  Social History Narrative  . Not on file     Family History: The patient's family history includes Diabetes in her mother; Hypertension in her mother and paternal uncle. ROS:   Please see the history of present illness.    All other systems reviewed and are negative.  EKGs/Labs/Other Studies Reviewed:    The following studies were reviewed today:  EKG:  EKG ordered today and personally reviewed.  The ekg ordered today demonstrates sinus rhythm and normal  Recent Labs: 11/12/2018 cholesterol 199 LDL 96 HDL 83 A1c 5.7% creatinine 0.75.  Physical Exam:    VS:  BP 122/82 (BP Location: Right Arm, Patient Position: Sitting, Cuff Size: Normal)   Pulse 68   Ht 5\' 4"   (1.626 m)   Wt 179 lb (81.2 kg)   SpO2 98%   BMI 30.73 kg/m     Wt Readings from Last 3 Encounters:  03/18/19 179 lb (81.2 kg)  02/25/18 178 lb 3.2 oz (80.8 kg)  02/27/17 188 lb (85.3 kg)     GEN:  Well nourished, well developed in no acute distress HEENT: Normal NECK: No JVD; No carotid bruits LYMPHATICS: No lymphadenopathy CARDIAC: RRR, no murmurs, rubs, gallops RESPIRATORY:  Clear to auscultation without rales,  wheezing or rhonchi  ABDOMEN: Soft, non-tender, non-distended MUSCULOSKELETAL:  No edema; No deformity  SKIN: Warm and dry NEUROLOGIC:  Alert and oriented x 3 PSYCHIATRIC:  Normal affect    Signed, Shirlee More, MD  03/18/2019 8:26 AM    [place]

## 2019-06-07 ENCOUNTER — Other Ambulatory Visit: Payer: Self-pay | Admitting: Cardiology

## 2019-07-30 DIAGNOSIS — N951 Menopausal and female climacteric states: Secondary | ICD-10-CM | POA: Diagnosis not present

## 2019-07-30 DIAGNOSIS — R110 Nausea: Secondary | ICD-10-CM | POA: Diagnosis not present

## 2019-07-30 DIAGNOSIS — Z6830 Body mass index (BMI) 30.0-30.9, adult: Secondary | ICD-10-CM | POA: Diagnosis not present

## 2019-08-06 DIAGNOSIS — E782 Mixed hyperlipidemia: Secondary | ICD-10-CM | POA: Diagnosis not present

## 2019-08-06 DIAGNOSIS — R7303 Prediabetes: Secondary | ICD-10-CM | POA: Diagnosis not present

## 2019-08-13 DIAGNOSIS — N951 Menopausal and female climacteric states: Secondary | ICD-10-CM | POA: Diagnosis not present

## 2019-08-13 DIAGNOSIS — I2510 Atherosclerotic heart disease of native coronary artery without angina pectoris: Secondary | ICD-10-CM | POA: Diagnosis not present

## 2019-08-13 DIAGNOSIS — Z139 Encounter for screening, unspecified: Secondary | ICD-10-CM | POA: Diagnosis not present

## 2019-08-13 DIAGNOSIS — I129 Hypertensive chronic kidney disease with stage 1 through stage 4 chronic kidney disease, or unspecified chronic kidney disease: Secondary | ICD-10-CM | POA: Diagnosis not present

## 2019-08-13 DIAGNOSIS — N182 Chronic kidney disease, stage 2 (mild): Secondary | ICD-10-CM | POA: Diagnosis not present

## 2019-08-18 DIAGNOSIS — E669 Obesity, unspecified: Secondary | ICD-10-CM | POA: Diagnosis not present

## 2019-08-25 DIAGNOSIS — Z6830 Body mass index (BMI) 30.0-30.9, adult: Secondary | ICD-10-CM | POA: Diagnosis not present

## 2019-08-25 DIAGNOSIS — E669 Obesity, unspecified: Secondary | ICD-10-CM | POA: Diagnosis not present

## 2019-09-01 DIAGNOSIS — E669 Obesity, unspecified: Secondary | ICD-10-CM | POA: Diagnosis not present

## 2019-09-01 DIAGNOSIS — Z6830 Body mass index (BMI) 30.0-30.9, adult: Secondary | ICD-10-CM | POA: Diagnosis not present

## 2019-09-08 DIAGNOSIS — Z6830 Body mass index (BMI) 30.0-30.9, adult: Secondary | ICD-10-CM | POA: Diagnosis not present

## 2019-09-08 DIAGNOSIS — E669 Obesity, unspecified: Secondary | ICD-10-CM | POA: Diagnosis not present

## 2019-09-15 DIAGNOSIS — Z6830 Body mass index (BMI) 30.0-30.9, adult: Secondary | ICD-10-CM | POA: Diagnosis not present

## 2019-09-15 DIAGNOSIS — E669 Obesity, unspecified: Secondary | ICD-10-CM | POA: Diagnosis not present

## 2019-09-22 DIAGNOSIS — Z6830 Body mass index (BMI) 30.0-30.9, adult: Secondary | ICD-10-CM | POA: Diagnosis not present

## 2019-09-22 DIAGNOSIS — E669 Obesity, unspecified: Secondary | ICD-10-CM | POA: Diagnosis not present

## 2019-09-29 DIAGNOSIS — E669 Obesity, unspecified: Secondary | ICD-10-CM | POA: Diagnosis not present

## 2019-10-06 DIAGNOSIS — E669 Obesity, unspecified: Secondary | ICD-10-CM | POA: Diagnosis not present

## 2019-10-06 DIAGNOSIS — Z6830 Body mass index (BMI) 30.0-30.9, adult: Secondary | ICD-10-CM | POA: Diagnosis not present

## 2019-10-13 DIAGNOSIS — Z01419 Encounter for gynecological examination (general) (routine) without abnormal findings: Secondary | ICD-10-CM | POA: Diagnosis not present

## 2019-10-13 DIAGNOSIS — N951 Menopausal and female climacteric states: Secondary | ICD-10-CM | POA: Diagnosis not present

## 2019-10-13 DIAGNOSIS — Z1231 Encounter for screening mammogram for malignant neoplasm of breast: Secondary | ICD-10-CM | POA: Diagnosis not present

## 2019-10-20 DIAGNOSIS — E669 Obesity, unspecified: Secondary | ICD-10-CM | POA: Diagnosis not present

## 2019-10-27 DIAGNOSIS — E669 Obesity, unspecified: Secondary | ICD-10-CM | POA: Diagnosis not present

## 2019-10-27 DIAGNOSIS — Z6830 Body mass index (BMI) 30.0-30.9, adult: Secondary | ICD-10-CM | POA: Diagnosis not present

## 2019-11-03 DIAGNOSIS — E669 Obesity, unspecified: Secondary | ICD-10-CM | POA: Diagnosis not present

## 2019-11-10 DIAGNOSIS — Z6830 Body mass index (BMI) 30.0-30.9, adult: Secondary | ICD-10-CM | POA: Diagnosis not present

## 2019-11-10 DIAGNOSIS — E669 Obesity, unspecified: Secondary | ICD-10-CM | POA: Diagnosis not present

## 2019-11-17 DIAGNOSIS — Z6830 Body mass index (BMI) 30.0-30.9, adult: Secondary | ICD-10-CM | POA: Diagnosis not present

## 2019-11-17 DIAGNOSIS — E669 Obesity, unspecified: Secondary | ICD-10-CM | POA: Diagnosis not present

## 2019-11-24 DIAGNOSIS — Z6830 Body mass index (BMI) 30.0-30.9, adult: Secondary | ICD-10-CM | POA: Diagnosis not present

## 2019-11-24 DIAGNOSIS — E669 Obesity, unspecified: Secondary | ICD-10-CM | POA: Diagnosis not present

## 2019-12-08 DIAGNOSIS — Z6830 Body mass index (BMI) 30.0-30.9, adult: Secondary | ICD-10-CM | POA: Diagnosis not present

## 2019-12-08 DIAGNOSIS — E669 Obesity, unspecified: Secondary | ICD-10-CM | POA: Diagnosis not present

## 2019-12-10 DIAGNOSIS — R7303 Prediabetes: Secondary | ICD-10-CM | POA: Diagnosis not present

## 2019-12-10 DIAGNOSIS — E782 Mixed hyperlipidemia: Secondary | ICD-10-CM | POA: Diagnosis not present

## 2019-12-12 ENCOUNTER — Other Ambulatory Visit: Payer: Self-pay | Admitting: Cardiology

## 2019-12-15 NOTE — Telephone Encounter (Signed)
*  STAT* If patient is at the pharmacy, call can be transferred to refill team.   1. Which medications need to be refilled? (please list name of each medication and dose if known) Amlodipine  2. Which pharmacy/location (including street and city if local pharmacy) is medication to be sent to? [facility] [place]  3. Do they need a 30 day or 90 day supply?90 days and refills- need them today please, she is out

## 2019-12-17 DIAGNOSIS — I129 Hypertensive chronic kidney disease with stage 1 through stage 4 chronic kidney disease, or unspecified chronic kidney disease: Secondary | ICD-10-CM | POA: Diagnosis not present

## 2019-12-17 DIAGNOSIS — N182 Chronic kidney disease, stage 2 (mild): Secondary | ICD-10-CM | POA: Diagnosis not present

## 2019-12-17 DIAGNOSIS — R7303 Prediabetes: Secondary | ICD-10-CM | POA: Diagnosis not present

## 2019-12-17 DIAGNOSIS — E782 Mixed hyperlipidemia: Secondary | ICD-10-CM | POA: Diagnosis not present

## 2019-12-22 DIAGNOSIS — E669 Obesity, unspecified: Secondary | ICD-10-CM | POA: Diagnosis not present

## 2019-12-22 DIAGNOSIS — Z6830 Body mass index (BMI) 30.0-30.9, adult: Secondary | ICD-10-CM | POA: Diagnosis not present

## 2020-01-05 DIAGNOSIS — Z6830 Body mass index (BMI) 30.0-30.9, adult: Secondary | ICD-10-CM | POA: Diagnosis not present

## 2020-01-05 DIAGNOSIS — R7303 Prediabetes: Secondary | ICD-10-CM | POA: Diagnosis not present

## 2020-01-05 DIAGNOSIS — E669 Obesity, unspecified: Secondary | ICD-10-CM | POA: Diagnosis not present

## 2020-01-19 DIAGNOSIS — Z6830 Body mass index (BMI) 30.0-30.9, adult: Secondary | ICD-10-CM | POA: Diagnosis not present

## 2020-01-19 DIAGNOSIS — E669 Obesity, unspecified: Secondary | ICD-10-CM | POA: Diagnosis not present

## 2020-01-24 DIAGNOSIS — Z1331 Encounter for screening for depression: Secondary | ICD-10-CM | POA: Diagnosis not present

## 2020-01-24 DIAGNOSIS — Z23 Encounter for immunization: Secondary | ICD-10-CM | POA: Diagnosis not present

## 2020-01-24 DIAGNOSIS — D239 Other benign neoplasm of skin, unspecified: Secondary | ICD-10-CM | POA: Diagnosis not present

## 2020-01-24 DIAGNOSIS — Z139 Encounter for screening, unspecified: Secondary | ICD-10-CM | POA: Diagnosis not present

## 2020-01-24 DIAGNOSIS — Z0000 Encounter for general adult medical examination without abnormal findings: Secondary | ICD-10-CM | POA: Diagnosis not present

## 2020-01-24 DIAGNOSIS — J029 Acute pharyngitis, unspecified: Secondary | ICD-10-CM | POA: Diagnosis not present

## 2020-02-11 DIAGNOSIS — M8580 Other specified disorders of bone density and structure, unspecified site: Secondary | ICD-10-CM | POA: Diagnosis not present

## 2020-02-11 DIAGNOSIS — Z6829 Body mass index (BMI) 29.0-29.9, adult: Secondary | ICD-10-CM | POA: Diagnosis not present

## 2020-02-11 DIAGNOSIS — D239 Other benign neoplasm of skin, unspecified: Secondary | ICD-10-CM | POA: Diagnosis not present

## 2020-02-16 DIAGNOSIS — Z8639 Personal history of other endocrine, nutritional and metabolic disease: Secondary | ICD-10-CM | POA: Diagnosis not present

## 2020-02-16 DIAGNOSIS — Z6829 Body mass index (BMI) 29.0-29.9, adult: Secondary | ICD-10-CM | POA: Diagnosis not present

## 2020-03-04 DIAGNOSIS — J209 Acute bronchitis, unspecified: Secondary | ICD-10-CM | POA: Diagnosis not present

## 2020-03-04 DIAGNOSIS — Z20828 Contact with and (suspected) exposure to other viral communicable diseases: Secondary | ICD-10-CM | POA: Diagnosis not present

## 2020-03-06 DIAGNOSIS — M8589 Other specified disorders of bone density and structure, multiple sites: Secondary | ICD-10-CM | POA: Diagnosis not present

## 2020-03-06 DIAGNOSIS — E2839 Other primary ovarian failure: Secondary | ICD-10-CM | POA: Diagnosis not present

## 2020-03-08 DIAGNOSIS — R059 Cough, unspecified: Secondary | ICD-10-CM | POA: Diagnosis not present

## 2020-03-08 DIAGNOSIS — Z6829 Body mass index (BMI) 29.0-29.9, adult: Secondary | ICD-10-CM | POA: Diagnosis not present

## 2020-03-15 DIAGNOSIS — Z6829 Body mass index (BMI) 29.0-29.9, adult: Secondary | ICD-10-CM | POA: Diagnosis not present

## 2020-03-15 DIAGNOSIS — R7303 Prediabetes: Secondary | ICD-10-CM | POA: Diagnosis not present

## 2020-03-16 ENCOUNTER — Other Ambulatory Visit: Payer: Self-pay

## 2020-03-16 DIAGNOSIS — E7800 Pure hypercholesterolemia, unspecified: Secondary | ICD-10-CM | POA: Insufficient documentation

## 2020-03-16 DIAGNOSIS — I10 Essential (primary) hypertension: Secondary | ICD-10-CM | POA: Insufficient documentation

## 2020-03-16 NOTE — Progress Notes (Signed)
Cardiology Office Note:    Date:  03/17/2020   ID:  Kristin Stevenson, DOB March 08, 1956, MRN [id number]  PCP:  Marco Collie, MD  Cardiologist:  Shirlee More, MD    Referring MD: Marco Collie, MD   ASSESSMENT:    1. Coronary artery vasospasm ([place])   2. Essential hypertension   3. Dyslipidemia    PLAN:    In order of problems listed above:  1. Coronary Artery Vasospasm, stable: pt will continue ASA, patient will continue to take Nitro SL PRN for chest pains 2. HTN, stable. BP within goal today. Continue lisinopril 20mg  & amlodipine 5mg  3. HLD, stable: tolerating medium intensity statin without myalgias.  Prediabetes addressed she is in a lifestyle modification program to her primary care physician  Next appointment: 6 months    Medication Adjustments/Labs and Tests Ordered: Current medicines are reviewed at length with the patient today.  Concerns regarding medicines are outlined above.  Orders Placed This Encounter  Procedures  . EKG 12-Lead   No orders of the defined types were placed in this encounter.   Chief Complaint  Patient presents with  . Follow-up    coronary vasospasm  . Hyperlipidemia    History of Present Illness:    Kristin Stevenson is a 64 y.o. female with a hx of coronary vasospasm with non-ST elevation MI October 2015 with normal coronary arteriography and apical hypokinesia, hypertension and dyslipidemia last seen 03/18/2019.  Kristin Stevenson states that she has had occasional episodes of chest discomfort later in the day that resolve with using SL nitroglycerin. She also reports working with her PCP to lose weight in attempts to improve after being diagnosed with prediabetes. She reports tolerance of her statin therapy and antihypertensives without myalgias or episodes of hypotensive symptoms.   Compliance with diet, lifestyle and medications: yes    Past Medical History:  Diagnosis Date  . Dyslipidemia 01/31/2015  . Essential hypertension 01/31/2015  .  Hypercholesteremia   . Hypertension   . Non-STEMI (non-ST elevated myocardial infarction) ([place]) 01/31/2015   Overview:  Cardiac cath 02/19/14 with normal coronary arteriography mild apical hypokinesia EF 60%    Past Surgical History:  Procedure Laterality Date  . CESAREAN SECTION  1981  . HYSTEROSCOPY WITH D & C  01/03/2012   Procedure: DILATATION AND CURETTAGE /HYSTEROSCOPY;  Surgeon: Selinda Orion, MD;  Location: [facility];  Service: Gynecology;  Laterality: N/A;  WITH RESECTION OF FIBROIDS     . REPEAT CESAREAN SECTION  1983  . TONSILLECTOMY      Current Medications: Current Meds  Medication Sig  . amLODipine (NORVASC) 5 MG tablet TAKE 1 TABLET BY MOUTH ONCE DAILY. NEED OFFICE VISIT FOR MORE REFILLS.  Marland Kitchen aspirin 81 MG tablet Take 81 mg by mouth daily.  Marland Kitchen azithromycin (ZITHROMAX) 500 MG tablet 500 mg.  . benzonatate (TESSALON) 100 MG capsule Take 100 mg by mouth 3 (three) times daily.  Marland Kitchen BRISDELLE 7.5 MG CAPS Take 7.5 mg by mouth daily.   . Cholecalciferol (VITAMIN D-1000 MAX ST) 1000 units tablet Take 1,000 Units by mouth daily.   Marland Kitchen Co-Enzyme Q-10 100 MG CAPS Take 100 mg by mouth daily.   Marland Kitchen escitalopram (LEXAPRO) 10 MG tablet 10 mg.  . fluticasone (FLONASE) 50 MCG/ACT nasal spray Place into both nostrils.  Marland Kitchen lisinopril (PRINIVIL,ZESTRIL) 20 MG tablet Take 20 mg by mouth daily.   . Multiple Vitamin (MULTIVITAMIN) tablet Take 1 tablet by mouth daily.  . nitrofurantoin, macrocrystal-monohydrate, (MACROBID) 100 MG capsule  100 mg.  . nitrofurantoin, macrocrystal-monohydrate, (MACROBID) 100 MG capsule 100 mg.  . nitroGLYCERIN (NITROSTAT) 0.4 MG SL tablet Place 1 tablet (0.4 mg total) under the tongue every 5 (five) minutes as needed.  . simvastatin (ZOCOR) 20 MG tablet Take 20 mg by mouth every evening.     Allergies:   Patient has no known allergies.   Social History   Socioeconomic History  . Marital status: Unknown    Spouse name: Not on file  . Number of  children: Not on file  . Years of education: Not on file  . Highest education level: Not on file  Occupational History  . Not on file  Tobacco Use  . Smoking status: Never Smoker  . Smokeless tobacco: Never Used  Vaping Use  . Vaping Use: Never used  Substance and Sexual Activity  . Alcohol use: Yes    Comment: occasional  . Drug use: No  . Sexual activity: Not on file  Other Topics Concern  . Not on file  Social History Narrative  . Not on file   Social Determinants of Health   Financial Resource Strain:   . Difficulty of Paying Living Expenses: Not on file  Food Insecurity:   . Worried About Charity fundraiser in the Last Year: Not on file  . Ran Out of Food in the Last Year: Not on file  Transportation Needs:   . Lack of Transportation (Medical): Not on file  . Lack of Transportation (Non-Medical): Not on file  Physical Activity:   . Days of Exercise per Week: Not on file  . Minutes of Exercise per Session: Not on file  Stress:   . Feeling of Stress : Not on file  Social Connections:   . Frequency of Communication with Friends and Family: Not on file  . Frequency of Social Gatherings with Friends and Family: Not on file  . Attends Religious Services: Not on file  . Active Member of Clubs or Organizations: Not on file  . Attends Archivist Meetings: Not on file  . Marital Status: Not on file     Family History: The patient's family history includes Diabetes in her mother; Hypertension in her mother and paternal uncle. ROS:   Please see the history of present illness.    All other systems reviewed and are negative.  EKGs/Labs/Other Studies Reviewed:    The following studies were reviewed today:  EKG:  EKG ordered today and personally reviewed.  The ekg ordered today demonstrates NSR   Recent Labs: 08 06 2021 cholesterol 176 LDL at target 96 triglycerides 122 HDL 72 A1c 5.6%  Physical Exam:    VS:  BP 136/66   Pulse 91   Ht 5\' 3"  (1.6 m)   Wt  173 lb 9.6 oz (78.7 kg)   SpO2 97%   BMI 30.75 kg/m     Wt Readings from Last 3 Encounters:  03/17/20 173 lb 9.6 oz (78.7 kg)  03/18/19 179 lb (81.2 kg)  02/25/18 178 lb 3.2 oz (80.8 kg)     GEN: female appearing stated age, well nourished, well developed in no acute distress HEENT: Normal NECK: No JVD; No carotid bruits LYMPHATICS: No lymphadenopathy CARDIAC: RRR, no murmurs, rubs, gallops, trace LE edema  RESPIRATORY:  Clear to auscultation without rales, wheezing or rhonchi  ABDOMEN: Soft, non-tender, non-distended MUSCULOSKELETAL:  No edema; No deformity  SKIN: Warm and dry NEUROLOGIC:  Alert and oriented x 3 PSYCHIATRIC:  Normal affect  Signed, Shirlee More, MD  03/17/2020 11:45 AM    [place]

## 2020-03-17 ENCOUNTER — Other Ambulatory Visit: Payer: Self-pay

## 2020-03-17 ENCOUNTER — Ambulatory Visit: Payer: PPO | Admitting: Cardiology

## 2020-03-17 ENCOUNTER — Encounter: Payer: Self-pay | Admitting: Cardiology

## 2020-03-17 VITALS — BP 136/66 | HR 91 | Ht 63.0 in | Wt 173.6 lb

## 2020-03-17 DIAGNOSIS — I201 Angina pectoris with documented spasm: Secondary | ICD-10-CM | POA: Diagnosis not present

## 2020-03-17 DIAGNOSIS — R7303 Prediabetes: Secondary | ICD-10-CM

## 2020-03-17 DIAGNOSIS — E785 Hyperlipidemia, unspecified: Secondary | ICD-10-CM

## 2020-03-17 DIAGNOSIS — I10 Essential (primary) hypertension: Secondary | ICD-10-CM | POA: Diagnosis not present

## 2020-03-17 NOTE — Patient Instructions (Signed)

## 2020-04-03 DIAGNOSIS — B078 Other viral warts: Secondary | ICD-10-CM | POA: Diagnosis not present

## 2020-04-03 DIAGNOSIS — D485 Neoplasm of uncertain behavior of skin: Secondary | ICD-10-CM | POA: Diagnosis not present

## 2020-04-12 DIAGNOSIS — Z23 Encounter for immunization: Secondary | ICD-10-CM | POA: Diagnosis not present

## 2020-04-12 DIAGNOSIS — R7303 Prediabetes: Secondary | ICD-10-CM | POA: Diagnosis not present

## 2020-04-12 DIAGNOSIS — Z6829 Body mass index (BMI) 29.0-29.9, adult: Secondary | ICD-10-CM | POA: Diagnosis not present

## 2020-04-12 DIAGNOSIS — E669 Obesity, unspecified: Secondary | ICD-10-CM | POA: Diagnosis not present

## 2020-04-13 DIAGNOSIS — R7303 Prediabetes: Secondary | ICD-10-CM | POA: Diagnosis not present

## 2020-04-13 DIAGNOSIS — E785 Hyperlipidemia, unspecified: Secondary | ICD-10-CM | POA: Diagnosis not present

## 2020-04-13 DIAGNOSIS — I10 Essential (primary) hypertension: Secondary | ICD-10-CM | POA: Diagnosis not present

## 2020-04-21 DIAGNOSIS — R7303 Prediabetes: Secondary | ICD-10-CM | POA: Diagnosis not present

## 2020-04-21 DIAGNOSIS — B079 Viral wart, unspecified: Secondary | ICD-10-CM | POA: Diagnosis not present

## 2020-04-21 DIAGNOSIS — I10 Essential (primary) hypertension: Secondary | ICD-10-CM | POA: Diagnosis not present

## 2020-04-21 DIAGNOSIS — Z23 Encounter for immunization: Secondary | ICD-10-CM | POA: Diagnosis not present

## 2020-04-21 DIAGNOSIS — E785 Hyperlipidemia, unspecified: Secondary | ICD-10-CM | POA: Diagnosis not present

## 2020-05-24 DIAGNOSIS — Z6829 Body mass index (BMI) 29.0-29.9, adult: Secondary | ICD-10-CM | POA: Diagnosis not present

## 2020-05-24 DIAGNOSIS — E669 Obesity, unspecified: Secondary | ICD-10-CM | POA: Diagnosis not present

## 2020-06-21 DIAGNOSIS — E669 Obesity, unspecified: Secondary | ICD-10-CM | POA: Diagnosis not present

## 2020-06-21 DIAGNOSIS — Z6829 Body mass index (BMI) 29.0-29.9, adult: Secondary | ICD-10-CM | POA: Diagnosis not present

## 2020-07-19 DIAGNOSIS — E669 Obesity, unspecified: Secondary | ICD-10-CM | POA: Diagnosis not present

## 2020-07-19 DIAGNOSIS — Z6829 Body mass index (BMI) 29.0-29.9, adult: Secondary | ICD-10-CM | POA: Diagnosis not present

## 2020-08-15 DIAGNOSIS — R7303 Prediabetes: Secondary | ICD-10-CM | POA: Diagnosis not present

## 2020-08-15 DIAGNOSIS — E785 Hyperlipidemia, unspecified: Secondary | ICD-10-CM | POA: Diagnosis not present

## 2020-08-15 DIAGNOSIS — I10 Essential (primary) hypertension: Secondary | ICD-10-CM | POA: Diagnosis not present

## 2020-08-21 DIAGNOSIS — E785 Hyperlipidemia, unspecified: Secondary | ICD-10-CM | POA: Diagnosis not present

## 2020-08-21 DIAGNOSIS — M19041 Primary osteoarthritis, right hand: Secondary | ICD-10-CM | POA: Diagnosis not present

## 2020-08-21 DIAGNOSIS — R7303 Prediabetes: Secondary | ICD-10-CM | POA: Diagnosis not present

## 2020-08-21 DIAGNOSIS — I10 Essential (primary) hypertension: Secondary | ICD-10-CM | POA: Diagnosis not present

## 2020-08-28 ENCOUNTER — Other Ambulatory Visit: Payer: Self-pay | Admitting: Cardiology

## 2020-08-30 DIAGNOSIS — E669 Obesity, unspecified: Secondary | ICD-10-CM | POA: Diagnosis not present

## 2020-08-30 DIAGNOSIS — Z6829 Body mass index (BMI) 29.0-29.9, adult: Secondary | ICD-10-CM | POA: Diagnosis not present

## 2020-09-19 ENCOUNTER — Other Ambulatory Visit: Payer: Self-pay | Admitting: Family Medicine

## 2020-09-19 DIAGNOSIS — N6320 Unspecified lump in the left breast, unspecified quadrant: Secondary | ICD-10-CM

## 2020-09-26 DIAGNOSIS — R82998 Other abnormal findings in urine: Secondary | ICD-10-CM | POA: Diagnosis not present

## 2020-09-26 DIAGNOSIS — N390 Urinary tract infection, site not specified: Secondary | ICD-10-CM | POA: Diagnosis not present

## 2020-09-26 DIAGNOSIS — R3915 Urgency of urination: Secondary | ICD-10-CM | POA: Diagnosis not present

## 2020-09-26 DIAGNOSIS — Z6829 Body mass index (BMI) 29.0-29.9, adult: Secondary | ICD-10-CM | POA: Diagnosis not present

## 2020-09-27 DIAGNOSIS — E669 Obesity, unspecified: Secondary | ICD-10-CM | POA: Diagnosis not present

## 2020-09-27 DIAGNOSIS — Z6829 Body mass index (BMI) 29.0-29.9, adult: Secondary | ICD-10-CM | POA: Diagnosis not present

## 2020-10-25 ENCOUNTER — Ambulatory Visit
Admission: RE | Admit: 2020-10-25 | Discharge: 2020-10-25 | Disposition: A | Discharge status: Home / Self Care | Payer: PPO | Source: Ambulatory Visit | Attending: Family Medicine | Admitting: Family Medicine

## 2020-10-25 ENCOUNTER — Other Ambulatory Visit: Payer: Self-pay

## 2020-10-25 DIAGNOSIS — N6320 Unspecified lump in the left breast, unspecified quadrant: Secondary | ICD-10-CM

## 2020-10-25 DIAGNOSIS — R922 Inconclusive mammogram: Secondary | ICD-10-CM | POA: Diagnosis not present

## 2020-12-15 DIAGNOSIS — I10 Essential (primary) hypertension: Secondary | ICD-10-CM | POA: Diagnosis not present

## 2020-12-15 DIAGNOSIS — R7303 Prediabetes: Secondary | ICD-10-CM | POA: Diagnosis not present

## 2020-12-15 DIAGNOSIS — E785 Hyperlipidemia, unspecified: Secondary | ICD-10-CM | POA: Diagnosis not present

## 2020-12-22 DIAGNOSIS — Z7189 Other specified counseling: Secondary | ICD-10-CM | POA: Diagnosis not present

## 2020-12-22 DIAGNOSIS — R7303 Prediabetes: Secondary | ICD-10-CM | POA: Diagnosis not present

## 2020-12-22 DIAGNOSIS — Z1339 Encounter for screening examination for other mental health and behavioral disorders: Secondary | ICD-10-CM | POA: Diagnosis not present

## 2020-12-22 DIAGNOSIS — Z6829 Body mass index (BMI) 29.0-29.9, adult: Secondary | ICD-10-CM | POA: Diagnosis not present

## 2020-12-22 DIAGNOSIS — E785 Hyperlipidemia, unspecified: Secondary | ICD-10-CM | POA: Diagnosis not present

## 2020-12-22 DIAGNOSIS — Z1331 Encounter for screening for depression: Secondary | ICD-10-CM | POA: Diagnosis not present

## 2020-12-22 DIAGNOSIS — I10 Essential (primary) hypertension: Secondary | ICD-10-CM | POA: Diagnosis not present

## 2020-12-22 DIAGNOSIS — Z0000 Encounter for general adult medical examination without abnormal findings: Secondary | ICD-10-CM | POA: Diagnosis not present

## 2020-12-22 DIAGNOSIS — Z139 Encounter for screening, unspecified: Secondary | ICD-10-CM | POA: Diagnosis not present

## 2021-01-17 DIAGNOSIS — Z23 Encounter for immunization: Secondary | ICD-10-CM | POA: Diagnosis not present

## 2021-01-25 DIAGNOSIS — Z1152 Encounter for screening for COVID-19: Secondary | ICD-10-CM | POA: Diagnosis not present

## 2021-01-25 DIAGNOSIS — J020 Streptococcal pharyngitis: Secondary | ICD-10-CM | POA: Diagnosis not present

## 2021-01-25 DIAGNOSIS — Z20822 Contact with and (suspected) exposure to covid-19: Secondary | ICD-10-CM | POA: Diagnosis not present

## 2021-01-31 DIAGNOSIS — L918 Other hypertrophic disorders of the skin: Secondary | ICD-10-CM | POA: Diagnosis not present

## 2021-01-31 DIAGNOSIS — Z6829 Body mass index (BMI) 29.0-29.9, adult: Secondary | ICD-10-CM | POA: Diagnosis not present

## 2021-01-31 DIAGNOSIS — Z0000 Encounter for general adult medical examination without abnormal findings: Secondary | ICD-10-CM | POA: Diagnosis not present

## 2021-02-07 DIAGNOSIS — L918 Other hypertrophic disorders of the skin: Secondary | ICD-10-CM | POA: Diagnosis not present

## 2021-03-25 ENCOUNTER — Other Ambulatory Visit: Payer: Self-pay | Admitting: Cardiology

## 2021-03-25 NOTE — Progress Notes (Signed)
Cardiology Office Note:    Date:  03/26/2021   ID:  Kristin Stevenson, DOB 07-Mar-1956, MRN [id number]  PCP:  Marco Collie, MD  Cardiologist:  Shirlee More, MD    Referring MD: Marco Collie, MD    ASSESSMENT:    1. Coronary artery vasospasm ([place])   2. Essential hypertension   3. Dyslipidemia    PLAN:    In order of problems listed above:  Gallop continues to do well with rare emotionally induced angina quickly relieved with nitroglycerin and she will continue her current medical regimen including aspirin simvastatin lisinopril and amlodipine.  I will refill her nitroglycerin and elevate she requires any cardiac diagnostic testing at this time BP at target continue current treatment Stable continue her current statin.  With normal coronary arteriography did not think she needs a goal LDL is less than 70   Next appointment: 1 year   Medication Adjustments/Labs and Tests Ordered: Current medicines are reviewed at length with the patient today.  Concerns regarding medicines are outlined above.  Orders Placed This Encounter  Procedures   EKG 12-Lead   Meds ordered this encounter  Medications   nitroGLYCERIN (NITROSTAT) 0.4 MG SL tablet    Sig: Place 1 tablet (0.4 mg total) under the tongue every 5 (five) minutes as needed.    Dispense:  25 tablet    Refill:  11    Chief complaint: Follow-up for coronary vasospasm History of Present Illness:    Kristin Stevenson is a 65 y.o. female with a hx of coronary vasospasm with non-ST elevation MI October 2015 with normal coronary arteriography and focal apical hypokinesia, hypertension and dyslipidemia last seen 03/17/2020. Compliance with diet, lifestyle and medications: Yes  She is doing quite well rarely has anginal discomfort always emotionally triggered and quickly relieved with an infrequent nitroglycerin She does regular exercise classes for balance or strength aerobics and has no angina with activity shortness of breath edema  palpitation or syncope. She tolerates her statin without muscle pain or weakness. Recent labs 12/15/2020 stable or improved cholesterol 199 LDL 104 triglycerides 93 HDL 79 A1c 5.6% Occasionally checks blood pressure at home always in range Past Medical History:  Diagnosis Date   Dyslipidemia 01/31/2015   Essential hypertension 01/31/2015   Hypercholesteremia    Hypertension    Non-STEMI (non-ST elevated myocardial infarction) ([place]) 01/31/2015   Overview:  Cardiac cath 02/19/14 with normal coronary arteriography mild apical hypokinesia EF 60%    Past Surgical History:  Procedure Laterality Date   CESAREAN SECTION  1981   HYSTEROSCOPY WITH D & C  01/03/2012   Procedure: DILATATION AND CURETTAGE /HYSTEROSCOPY;  Surgeon: Selinda Orion, MD;  Location: [facility];  Service: Gynecology;  Laterality: N/A;  WITH RESECTION OF FIBROIDS      REPEAT CESAREAN SECTION  1983   TONSILLECTOMY      Current Medications: Current Meds  Medication Sig   amLODipine (NORVASC) 5 MG tablet Take 1 tablet by mouth once daily   aspirin 81 MG tablet Take 81 mg by mouth daily.   azithromycin (ZITHROMAX) 500 MG tablet 500 mg.   Cholecalciferol 25 MCG (1000 UT) tablet Take 1,000 Units by mouth daily.    Co-Enzyme Q-10 100 MG CAPS Take 100 mg by mouth daily.    escitalopram (LEXAPRO) 10 MG tablet Take 10 mg by mouth daily.   lisinopril (PRINIVIL,ZESTRIL) 20 MG tablet Take 20 mg by mouth daily.    Multiple Vitamins-Minerals (PRESERVISION AREDS 2+MULTI VIT PO) Take 1  tablet by mouth 2 (two) times daily.   Omega-3 Fatty Acids (FISH OIL) 1000 MG CPDR Take 1 capsule by mouth daily.   simvastatin (ZOCOR) 20 MG tablet Take 20 mg by mouth every evening.   [DISCONTINUED] nitroGLYCERIN (NITROSTAT) 0.4 MG SL tablet Place 1 tablet (0.4 mg total) under the tongue every 5 (five) minutes as needed.     Allergies:   Patient has no known allergies.   Social History   Socioeconomic History   Marital status:  Married    Spouse name: Not on file   Number of children: Not on file   Years of education: Not on file   Highest education level: Not on file  Occupational History   Not on file  Tobacco Use   Smoking status: Never    Passive exposure: Never   Smokeless tobacco: Never  Vaping Use   Vaping Use: Never used  Substance and Sexual Activity   Alcohol use: Yes    Comment: occasional   Drug use: No   Sexual activity: Not on file  Other Topics Concern   Not on file  Social History Narrative   Not on file   Social Determinants of Health   Financial Resource Strain: Not on file  Food Insecurity: Not on file  Transportation Needs: Not on file  Physical Activity: Not on file  Stress: Not on file  Social Connections: Not on file     Family History: The patient's family history includes Diabetes in her mother; Hypertension in her mother and paternal uncle. ROS:   Please see the history of present illness.    All other systems reviewed and are negative.  EKGs/Labs/Other Studies Reviewed:    The following studies were reviewed today:  EKG:  EKG ordered today and personally reviewed.  The ekg ordered today demonstrates sinus rhythm normal EKG  Recent Labs: See history  Physical Exam:    VS:  BP 136/84   Pulse 75   Ht 5\' 3"  (1.6 m)   Wt 175 lb 6.4 oz (79.6 kg)   SpO2 97%   BMI 31.07 kg/m     Wt Readings from Last 3 Encounters:  03/26/21 175 lb 6.4 oz (79.6 kg)  03/17/20 173 lb 9.6 oz (78.7 kg)  03/18/19 179 lb (81.2 kg)     GEN:  Well nourished, well developed in no acute distress HEENT: Normal NECK: No JVD; No carotid bruits LYMPHATICS: No lymphadenopathy CARDIAC: RRR, no murmurs, rubs, gallops RESPIRATORY:  Clear to auscultation without rales, wheezing or rhonchi  ABDOMEN: Soft, non-tender, non-distended MUSCULOSKELETAL:  No edema; No deformity  SKIN: Warm and dry NEUROLOGIC:  Alert and oriented x 3 PSYCHIATRIC:  Normal affect    Signed, Shirlee More, MD   03/26/2021 2:31 PM    [facility]

## 2021-03-26 ENCOUNTER — Other Ambulatory Visit: Payer: Self-pay

## 2021-03-26 ENCOUNTER — Encounter: Payer: Self-pay | Admitting: Cardiology

## 2021-03-26 ENCOUNTER — Ambulatory Visit: Payer: PPO | Admitting: Cardiology

## 2021-03-26 VITALS — BP 136/84 | HR 75 | Ht 63.0 in | Wt 175.4 lb

## 2021-03-26 DIAGNOSIS — I10 Essential (primary) hypertension: Secondary | ICD-10-CM | POA: Diagnosis not present

## 2021-03-26 DIAGNOSIS — I201 Angina pectoris with documented spasm: Secondary | ICD-10-CM

## 2021-03-26 DIAGNOSIS — E785 Hyperlipidemia, unspecified: Secondary | ICD-10-CM | POA: Diagnosis not present

## 2021-03-26 MED ORDER — NITROGLYCERIN 0.4 MG SL SUBL: 0.4000 mg | SUBLINGUAL_TABLET | SUBLINGUAL | 11 refills | Status: DC | PRN

## 2021-03-26 NOTE — Patient Instructions (Signed)

## 2021-04-14 DIAGNOSIS — J029 Acute pharyngitis, unspecified: Secondary | ICD-10-CM | POA: Diagnosis not present

## 2021-04-20 DIAGNOSIS — Z20822 Contact with and (suspected) exposure to covid-19: Secondary | ICD-10-CM | POA: Diagnosis not present

## 2021-04-20 DIAGNOSIS — R059 Cough, unspecified: Secondary | ICD-10-CM | POA: Diagnosis not present

## 2021-04-20 DIAGNOSIS — J029 Acute pharyngitis, unspecified: Secondary | ICD-10-CM | POA: Diagnosis not present

## 2021-04-26 DIAGNOSIS — Z139 Encounter for screening, unspecified: Secondary | ICD-10-CM | POA: Diagnosis not present

## 2021-04-26 DIAGNOSIS — R3915 Urgency of urination: Secondary | ICD-10-CM | POA: Diagnosis not present

## 2021-04-26 DIAGNOSIS — Z1331 Encounter for screening for depression: Secondary | ICD-10-CM | POA: Diagnosis not present

## 2021-04-26 DIAGNOSIS — N390 Urinary tract infection, site not specified: Secondary | ICD-10-CM | POA: Diagnosis not present

## 2021-04-26 DIAGNOSIS — R82998 Other abnormal findings in urine: Secondary | ICD-10-CM | POA: Diagnosis not present

## 2021-05-09 DIAGNOSIS — I10 Essential (primary) hypertension: Secondary | ICD-10-CM | POA: Diagnosis not present

## 2021-05-09 DIAGNOSIS — R7303 Prediabetes: Secondary | ICD-10-CM | POA: Diagnosis not present

## 2021-05-09 DIAGNOSIS — E785 Hyperlipidemia, unspecified: Secondary | ICD-10-CM | POA: Diagnosis not present

## 2021-05-18 DIAGNOSIS — I10 Essential (primary) hypertension: Secondary | ICD-10-CM | POA: Diagnosis not present

## 2021-05-18 DIAGNOSIS — E785 Hyperlipidemia, unspecified: Secondary | ICD-10-CM | POA: Diagnosis not present

## 2021-05-18 DIAGNOSIS — R051 Acute cough: Secondary | ICD-10-CM | POA: Diagnosis not present

## 2021-05-18 DIAGNOSIS — Z20822 Contact with and (suspected) exposure to covid-19: Secondary | ICD-10-CM | POA: Diagnosis not present

## 2021-05-18 DIAGNOSIS — J029 Acute pharyngitis, unspecified: Secondary | ICD-10-CM | POA: Diagnosis not present

## 2021-05-25 DIAGNOSIS — M1990 Unspecified osteoarthritis, unspecified site: Secondary | ICD-10-CM | POA: Insufficient documentation

## 2021-05-25 HISTORY — DX: Unspecified osteoarthritis, unspecified site: M19.90

## 2021-06-17 ENCOUNTER — Other Ambulatory Visit: Payer: Self-pay | Admitting: Cardiology

## 2021-07-10 DIAGNOSIS — Z6830 Body mass index (BMI) 30.0-30.9, adult: Secondary | ICD-10-CM | POA: Diagnosis not present

## 2021-07-10 DIAGNOSIS — E669 Obesity, unspecified: Secondary | ICD-10-CM | POA: Diagnosis not present

## 2021-07-10 DIAGNOSIS — M25562 Pain in left knee: Secondary | ICD-10-CM | POA: Diagnosis not present

## 2021-07-12 ENCOUNTER — Telehealth: Payer: Self-pay

## 2021-07-12 ENCOUNTER — Encounter: Payer: Self-pay | Admitting: Gastroenterology

## 2021-07-12 NOTE — Telephone Encounter (Signed)
I have called and left a voicemail for patient to return my call and schedule a visit with previsit for a recall colonoscopy.  ?

## 2021-07-18 ENCOUNTER — Ambulatory Visit (AMBULATORY_SURGERY_CENTER): Payer: PPO | Admitting: *Deleted

## 2021-07-18 ENCOUNTER — Other Ambulatory Visit: Payer: Self-pay

## 2021-07-18 VITALS — Ht 64.0 in | Wt 177.0 lb

## 2021-07-18 DIAGNOSIS — Z86010 Personal history of colonic polyps: Secondary | ICD-10-CM

## 2021-07-18 MED ORDER — PEG 3350-KCL-NA BICARB-NACL 420 G PO SOLR: 4000.0000 mL | Freq: Once | ORAL | 0 refills | Status: AC

## 2021-07-18 NOTE — Progress Notes (Signed)
No egg or soy allergy known to patient  ?No issues known to pt with past sedation with any surgeries or procedures ?Patient denies ever being told they had issues or difficulty with intubation  ?No FH of Malignant Hyperthermia ?Pt is not on diet pills ?Pt is not on  home 02  ?Pt is not on blood thinners  ?Pt denies issues with constipation, did a 2 day prep in 2018 so ordered a 2 day prep again with [place] and [employer]) ?No A fib or A flutter ? ? ? ?Due to the COVID-19 pandemic we are asking patients to follow certain guidelines in PV and the [place]   ?Pt aware of COVID protocols and [facility] guidelines  ? ?PV completed over the phone. Pt verified name, DOB, address and insurance during PV today.  ?Pt mailed instruction packet with copy of consent form to read and not return, and instructions.  ?Pt encouraged to call with questions or issues.  ?If pt has My chart, procedure instructions sent via My Chart   ?

## 2021-08-03 ENCOUNTER — Encounter: Payer: Self-pay | Admitting: Gastroenterology

## 2021-08-07 ENCOUNTER — Ambulatory Visit (AMBULATORY_SURGERY_CENTER): Payer: PPO | Admitting: Gastroenterology

## 2021-08-07 ENCOUNTER — Encounter: Payer: Self-pay | Admitting: Gastroenterology

## 2021-08-07 VITALS — BP 126/65 | HR 59 | Temp 98.9°F | Resp 18 | Ht 63.0 in | Wt 177.0 lb

## 2021-08-07 DIAGNOSIS — E785 Hyperlipidemia, unspecified: Secondary | ICD-10-CM | POA: Diagnosis not present

## 2021-08-07 DIAGNOSIS — Z86010 Personal history of colonic polyps: Secondary | ICD-10-CM | POA: Diagnosis not present

## 2021-08-07 DIAGNOSIS — I10 Essential (primary) hypertension: Secondary | ICD-10-CM | POA: Diagnosis not present

## 2021-08-07 DIAGNOSIS — E7800 Pure hypercholesterolemia, unspecified: Secondary | ICD-10-CM | POA: Diagnosis not present

## 2021-08-07 MED ORDER — SODIUM CHLORIDE 0.9 % IV SOLN: 500.0000 mL | Freq: Once | INTRAVENOUS | Status: DC

## 2021-08-07 NOTE — Op Note (Signed)
[place] [place] ?Patient Name: Kristin Stevenson ?Procedure Date: 08/07/2021 2:58 PM ?MRN: [id number] ?Endoscopist: Jackquline Denmark , MD ?Age: 66 ?Referring MD:  ?Date of Birth: 1955-09-05 ?Gender: Female ?Account #: [id number] ?Procedure:                Colonoscopy ?Indications:              High risk colon cancer surveillance: Personal  ?                          history of colonic polyps ?Medicines:                Monitored Anesthesia Care ?Procedure:                Pre-Anesthesia Assessment: ?                          - Prior to the procedure, a History and Physical  ?                          was performed, and patient medications and  ?                          allergies were reviewed. The patient's tolerance of  ?                          previous anesthesia was also reviewed. The risks  ?                          and benefits of the procedure and the sedation  ?                          options and risks were discussed with the patient.  ?                          All questions were answered, and informed consent  ?                          was obtained. Prior Anticoagulants: The patient has  ?                          taken no previous anticoagulant or antiplatelet  ?                          agents. ASA Grade Assessment: II - A patient with  ?                          mild systemic disease. After reviewing the risks  ?                          and benefits, the patient was deemed in  ?                          satisfactory condition to undergo the procedure. ?  After obtaining informed consent, the colonoscope  ?                          was passed under direct vision. Throughout the  ?                          procedure, the patient's blood pressure, pulse, and  ?                          oxygen saturations were monitored continuously. The  ?                          Olympus PCF-H190DL ([id number]) Colonoscope was  ?                          introduced through the anus and advanced to  the the  ?                          cecum, identified by appendiceal orifice and  ?                          ileocecal valve. The colonoscopy was performed  ?                          without difficulty. The patient tolerated the  ?                          procedure well. The quality of the bowel  ?                          preparation was good. The ileocecal valve,  ?                          appendiceal orifice, and rectum were photographed. ?Scope In: 3:04:25 PM ?Scope Out: 3:22:56 PM ?Scope Withdrawal Time: 0 hours 11 minutes 52 seconds  ?Total Procedure Duration: 0 hours 18 minutes 31 seconds  ?Findings:                 The colon (entire examined portion) appeared  ?                          normal. The colon was highly torturous. Passage of  ?                          scope required abdominal pressure. ?                          Rare medium-mouthed diverticula were found in the  ?                          sigmoid colon. ?                          Non-bleeding internal hemorrhoids were found during  ?  retroflexion. The hemorrhoids were small and Grade  ?                          I (internal hemorrhoids that do not prolapse). ?                          The exam was otherwise without abnormality on  ?                          direct and retroflexion views. ?Complications:            No immediate complications. ?Estimated Blood Loss:     Estimated blood loss: none. ?Impression:               - Minimal colonic diverticulosis. ?                          - Non-bleeding internal hemorrhoids. ?                          - The examination was otherwise normal on direct  ?                          and retroflexion views. ?                          - No specimens collected. ?Recommendation:           - Patient has a contact number available for  ?                          emergencies. The signs and symptoms of potential  ?                          delayed complications were discussed with the  ?                           patient. Return to normal activities tomorrow.  ?                          Written discharge instructions were provided to the  ?                          patient. ?                          - Resume previous diet. ?                          - Continue present medications. ?                          - Repeat colonoscopy is not recommended due to  ?                          current age (34 years or older) for screening  ?  purposes. Hence, repeat colonoscopy only if with  ?                          any new problems or change in family history. ?                          - The findings and recommendations were discussed  ?                          with the patient's family. ?Jackquline Denmark, MD ?08/07/2021 3:28:40 PM ?This report has been signed electronically. ?

## 2021-08-07 NOTE — Progress Notes (Signed)
Pt's states no medical or surgical changes since previsit or office visit. 

## 2021-08-07 NOTE — Progress Notes (Signed)
To Pacu, VSS. Report to [email or web address] ?

## 2021-08-07 NOTE — Patient Instructions (Signed)
Thank you for letting us take care of your healthcare needs today. ?PLease see handouts given to you on Hemorrhoids and Diverticulosis. ? ? ? ?YOU HAD AN ENDOSCOPIC PROCEDURE TODAY AT [facility]:   Refer to the procedure report that was given to you for any specific questions about what was found during the examination.  If the procedure report does not answer your questions, please call your gastroenterologist to clarify.  If you requested that your care partner not be given the details of your procedure findings, then the procedure report has been included in a sealed envelope for you to review at your convenience later. ? ?YOU SHOULD EXPECT: Some feelings of bloating in the abdomen. Passage of more gas than usual.  Walking can help get rid of the air that was put into your GI tract during the procedure and reduce the bloating. If you had a lower endoscopy (such as a colonoscopy or flexible sigmoidoscopy) you may notice spotting of blood in your stool or on the toilet paper. If you underwent a bowel prep for your procedure, you may not have a normal bowel movement for a few days. ? ?Please Note:  You might notice some irritation and congestion in your nose or some drainage.  This is from the oxygen used during your procedure.  There is no need for concern and it should clear up in a day or so. ? ?SYMPTOMS TO REPORT IMMEDIATELY: ? ?Following lower endoscopy (colonoscopy or flexible sigmoidoscopy): ? Excessive amounts of blood in the stool ? Significant tenderness or worsening of abdominal pains ? Swelling of the abdomen that is new, acute ? Fever of 100?F or higher ? ?For urgent or emergent issues, a gastroenterologist can be reached at any hour by calling [phone]. ?Do not use MyChart messaging for urgent concerns.  ? ? ?DIET:  We do recommend a small meal at first, but then you may proceed to your regular diet.  Drink plenty of fluids but you should avoid alcoholic beverages for 24  hours. ? ?ACTIVITY:  You should plan to take it easy for the rest of today and you should NOT DRIVE or use heavy machinery until tomorrow (because of the sedation medicines used during the test).   ? ?FOLLOW UP: ?Our staff will call the number listed on your records 48-72 hours following your procedure to check on you and address any questions or concerns that you may have regarding the information given to you following your procedure. If we do not reach you, we will leave a message.  We will attempt to reach you two times.  During this call, we will ask if you have developed any symptoms of COVID 19. If you develop any symptoms (ie: fever, flu-like symptoms, shortness of breath, cough etc.) before then, please call [phone].  If you test positive for Covid 19 in the 2 weeks post procedure, please call and report this information to [place].   ? ?If any biopsies were taken you will be contacted by phone or by letter within the next 1-3 weeks.  Please call us at [phone] if you have not heard about the biopsies in 3 weeks.  ? ? ?SIGNATURES/CONFIDENTIALITY: ?You and/or your care partner have signed paperwork which will be entered into your electronic medical record.  These signatures attest to the fact that that the information above on your After Visit Summary has been reviewed and is understood.  Full responsibility of the confidentiality of this discharge information lies  with you and/or your care-partner.  ?

## 2021-08-07 NOTE — Progress Notes (Signed)
[facility] History and Physical ? ? ?Primary Care Physician:  Marco Collie, MD ? ? ?Reason for Procedure:   History of polyps ? ?Plan:     colonoscopy ? ? ? ? ?HPI: Kristin Stevenson is a 66 y.o. female  ?No nausea, vomiting, heartburn, regurgitation, odynophagia or dysphagia.  No significant diarrhea or constipation.  No melena or hematochezia. No unintentional weight loss. No abdominal pain. ? ? ?Past Medical History:  ?Diagnosis Date  ? Dyslipidemia 01/31/2015  ? Essential hypertension 01/31/2015  ? Hypercholesteremia   ? Hypertension   ? Non-STEMI (non-ST elevated myocardial infarction) ([place]) 01/31/2015  ? Overview:  Cardiac cath 02/19/14 with normal coronary arteriography mild apical hypokinesia EF 60%  ? ? ?Past Surgical History:  ?Procedure Laterality Date  ? [place]  ? HYSTEROSCOPY WITH D & C  01/03/2012  ? Procedure: DILATATION AND CURETTAGE /HYSTEROSCOPY;  Surgeon: Selinda Orion, MD;  Location: [facility];  Service: Gynecology;  Laterality: N/A;  WITH RESECTION OF FIBROIDS ? ? ?  ? [place]  ? TONSILLECTOMY    ? ? ?Prior to Admission medications   ?Medication Sig Start Date End Date Taking? Authorizing Provider  ?amLODipine (NORVASC) 5 MG tablet Take 1 tablet by mouth once daily 06/18/21  Yes Munley, Hilton Cork, MD  ?aspirin 81 MG tablet Take 81 mg by mouth daily.   Yes [provider]  ?celecoxib (CELEBREX) 200 MG capsule  07/10/21  Yes [provider]  ?D-Mannose 500 MG CAPS  05/25/21  Yes [provider]  ?escitalopram (LEXAPRO) 10 MG tablet Take 10 mg by mouth daily. 03/03/20  Yes [provider]  ?lisinopril (PRINIVIL,ZESTRIL) 20 MG tablet Take 20 mg by mouth daily.    Yes [provider]  ?Multiple Vitamins-Minerals (PRESERVISION AREDS 2+MULTI VIT PO) Take 1 tablet by mouth 2 (two) times daily.   Yes [provider]  ?Omega-3 Fatty Acids (FISH OIL) 1000 MG CPDR Take 1 capsule by mouth daily.    Yes [provider]  ?simvastatin (ZOCOR) 20 MG tablet Take 20 mg by mouth every evening.   Yes [provider]  ?diphenhydrAMINE (BENADRYL) 25 mg capsule     [provider]  ?nitroGLYCERIN (NITROSTAT) 0.4 MG SL tablet Place 1 tablet (0.4 mg total) under the tongue every 5 (five) minutes as needed. 03/26/21   Richardo Priest, MD  ? ? ?Current Outpatient Medications  ?Medication Sig Dispense Refill  ? amLODipine (NORVASC) 5 MG tablet Take 1 tablet by mouth once daily 90 tablet 0  ? aspirin 81 MG tablet Take 81 mg by mouth daily.    ? celecoxib (CELEBREX) 200 MG capsule     ? D-Mannose 500 MG CAPS     ? escitalopram (LEXAPRO) 10 MG tablet Take 10 mg by mouth daily.    ? lisinopril (PRINIVIL,ZESTRIL) 20 MG tablet Take 20 mg by mouth daily.     ? Multiple Vitamins-Minerals (PRESERVISION AREDS 2+MULTI VIT PO) Take 1 tablet by mouth 2 (two) times daily.    ? Omega-3 Fatty Acids (FISH OIL) 1000 MG CPDR Take 1 capsule by mouth daily.    ? simvastatin (ZOCOR) 20 MG tablet Take 20 mg by mouth every evening.    ? diphenhydrAMINE (BENADRYL) 25 mg capsule     ? nitroGLYCERIN (NITROSTAT) 0.4 MG SL tablet Place 1 tablet (0.4 mg total) under the tongue every 5 (five) minutes as needed. 25 tablet 11  ? ?Current Facility-Administered Medications  ?  Medication Dose Route Frequency Provider Last Rate Last Admin  ? 0.9 %  sodium chloride infusion  500 mL Intravenous Once Jackquline Denmark, MD      ? ? ?Allergies as of 08/07/2021  ? (No Known Allergies)  ? ? ?Family History  ?Problem Relation Age of Onset  ? Hypertension Mother   ? Diabetes Mother   ? Hypertension Paternal Uncle   ? Colon cancer Maternal Grandmother   ? Colon polyps Neg Hx   ? Esophageal cancer Neg Hx   ? Stomach cancer Neg Hx   ? Rectal cancer Neg Hx   ? ? ?Social History  ? ?Socioeconomic History  ? Marital status: Married  ?  Spouse name: Not on file  ? Number of children: Not on file  ? Years of education: Not on file  ? Highest education  level: Not on file  ?Occupational History  ? Not on file  ?Tobacco Use  ? Smoking status: Never  ?  Passive exposure: Never  ? Smokeless tobacco: Never  ?Vaping Use  ? Vaping Use: Never used  ?Substance and Sexual Activity  ? Alcohol use: Yes  ?  Comment: occasional  ? Drug use: No  ? Sexual activity: Not on file  ?Other Topics Concern  ? Not on file  ?Social History Narrative  ? Not on file  ? ?Social Determinants of Health  ? ?Financial Resource Strain: Not on file  ?Food Insecurity: Not on file  ?Transportation Needs: Not on file  ?Physical Activity: Not on file  ?Stress: Not on file  ?Social Connections: Not on file  ?Intimate Partner Violence: Not on file  ? ? ?Review of Systems: ?Positive for  none ?All other review of systems negative except as mentioned in the HPI. ? ?Physical Exam: ?Vital signs in last 24 hours: ?'@VSRANGES'$ @ ?  ?General:   Alert,  Well-developed, well-nourished, pleasant and cooperative in NAD ?Lungs:  Clear throughout to auscultation.   ?Heart:  Regular rate and rhythm; no murmurs, clicks, rubs,  or gallops. ?Abdomen:  Soft, nontender and nondistended. Normal bowel sounds.   ?Neuro/Psych:  Alert and cooperative. Normal mood and affect. A and O x 3 ? ? ? ?No significant changes were identified.  The patient continues to be an appropriate candidate for the planned procedure and anesthesia. ? ? ?Carmell Austria, MD. ?[facility] ?08/07/2021 2:54 PM@ ? ?

## 2021-08-09 ENCOUNTER — Telehealth: Payer: Self-pay

## 2021-08-09 DIAGNOSIS — M1712 Unilateral primary osteoarthritis, left knee: Secondary | ICD-10-CM | POA: Diagnosis not present

## 2021-08-09 NOTE — Telephone Encounter (Signed)
?  Follow up Call- ? ? ?  08/07/2021  ?  2:21 PM  ?Call back number  ?Post procedure Call Back phone  # [phone]  ?Permission to leave phone message Yes  ?  ? ?Patient questions: ? ?Do you have a fever, pain , or abdominal swelling? No. ?Pain Score  0 * ? ?Have you tolerated food without any problems? Yes.   ? ?Have you been able to return to your normal activities? Yes.   ? ?Do you have any questions about your discharge instructions: ?Diet   No. ?Medications  No. ?Follow up visit  No. ? ?Do you have questions or concerns about your Care? No. ? ?Actions: ?* If pain score is 4 or above: ?No action needed, pain <4. ? ? ?

## 2021-08-22 DIAGNOSIS — M25562 Pain in left knee: Secondary | ICD-10-CM | POA: Diagnosis not present

## 2021-08-29 DIAGNOSIS — M25562 Pain in left knee: Secondary | ICD-10-CM | POA: Diagnosis not present

## 2021-09-05 DIAGNOSIS — M25562 Pain in left knee: Secondary | ICD-10-CM | POA: Diagnosis not present

## 2021-09-12 DIAGNOSIS — M25562 Pain in left knee: Secondary | ICD-10-CM | POA: Diagnosis not present

## 2021-09-14 DIAGNOSIS — R300 Dysuria: Secondary | ICD-10-CM | POA: Diagnosis not present

## 2021-09-16 ENCOUNTER — Other Ambulatory Visit: Payer: Self-pay | Admitting: Cardiology

## 2021-09-18 DIAGNOSIS — M25562 Pain in left knee: Secondary | ICD-10-CM | POA: Diagnosis not present

## 2021-09-21 DIAGNOSIS — M1712 Unilateral primary osteoarthritis, left knee: Secondary | ICD-10-CM | POA: Diagnosis not present

## 2021-10-09 DIAGNOSIS — I10 Essential (primary) hypertension: Secondary | ICD-10-CM | POA: Diagnosis not present

## 2021-10-09 DIAGNOSIS — E785 Hyperlipidemia, unspecified: Secondary | ICD-10-CM | POA: Diagnosis not present

## 2021-10-09 DIAGNOSIS — R7303 Prediabetes: Secondary | ICD-10-CM | POA: Diagnosis not present

## 2021-10-15 DIAGNOSIS — R7303 Prediabetes: Secondary | ICD-10-CM | POA: Diagnosis not present

## 2021-10-15 DIAGNOSIS — Z23 Encounter for immunization: Secondary | ICD-10-CM | POA: Diagnosis not present

## 2021-10-15 DIAGNOSIS — M179 Osteoarthritis of knee, unspecified: Secondary | ICD-10-CM | POA: Diagnosis not present

## 2021-10-15 DIAGNOSIS — E785 Hyperlipidemia, unspecified: Secondary | ICD-10-CM | POA: Diagnosis not present

## 2021-10-15 DIAGNOSIS — I10 Essential (primary) hypertension: Secondary | ICD-10-CM | POA: Diagnosis not present

## 2021-10-17 DIAGNOSIS — Z1231 Encounter for screening mammogram for malignant neoplasm of breast: Secondary | ICD-10-CM | POA: Diagnosis not present

## 2021-10-17 DIAGNOSIS — N951 Menopausal and female climacteric states: Secondary | ICD-10-CM | POA: Diagnosis not present

## 2021-10-17 DIAGNOSIS — Z01419 Encounter for gynecological examination (general) (routine) without abnormal findings: Secondary | ICD-10-CM | POA: Diagnosis not present

## 2022-02-06 DIAGNOSIS — Z139 Encounter for screening, unspecified: Secondary | ICD-10-CM | POA: Diagnosis not present

## 2022-02-06 DIAGNOSIS — Z0000 Encounter for general adult medical examination without abnormal findings: Secondary | ICD-10-CM | POA: Diagnosis not present

## 2022-02-06 DIAGNOSIS — Z1339 Encounter for screening examination for other mental health and behavioral disorders: Secondary | ICD-10-CM | POA: Diagnosis not present

## 2022-02-06 DIAGNOSIS — Z136 Encounter for screening for cardiovascular disorders: Secondary | ICD-10-CM | POA: Diagnosis not present

## 2022-02-06 DIAGNOSIS — Z1331 Encounter for screening for depression: Secondary | ICD-10-CM | POA: Diagnosis not present

## 2022-02-06 DIAGNOSIS — Z6830 Body mass index (BMI) 30.0-30.9, adult: Secondary | ICD-10-CM | POA: Diagnosis not present

## 2022-02-06 DIAGNOSIS — E669 Obesity, unspecified: Secondary | ICD-10-CM | POA: Diagnosis not present

## 2022-02-06 DIAGNOSIS — Z0189 Encounter for other specified special examinations: Secondary | ICD-10-CM | POA: Diagnosis not present

## 2022-02-06 DIAGNOSIS — Z23 Encounter for immunization: Secondary | ICD-10-CM | POA: Diagnosis not present

## 2022-03-18 DIAGNOSIS — Z20822 Contact with and (suspected) exposure to covid-19: Secondary | ICD-10-CM | POA: Diagnosis not present

## 2022-03-18 DIAGNOSIS — E785 Hyperlipidemia, unspecified: Secondary | ICD-10-CM | POA: Diagnosis not present

## 2022-03-18 DIAGNOSIS — R509 Fever, unspecified: Secondary | ICD-10-CM | POA: Diagnosis not present

## 2022-03-18 DIAGNOSIS — J0110 Acute frontal sinusitis, unspecified: Secondary | ICD-10-CM | POA: Diagnosis not present

## 2022-03-18 DIAGNOSIS — I10 Essential (primary) hypertension: Secondary | ICD-10-CM | POA: Diagnosis not present

## 2022-03-18 DIAGNOSIS — R7303 Prediabetes: Secondary | ICD-10-CM | POA: Diagnosis not present

## 2022-03-18 DIAGNOSIS — Z6829 Body mass index (BMI) 29.0-29.9, adult: Secondary | ICD-10-CM | POA: Diagnosis not present

## 2022-03-21 ENCOUNTER — Ambulatory Visit: Payer: PPO | Admitting: Cardiology

## 2022-03-25 DIAGNOSIS — R7303 Prediabetes: Secondary | ICD-10-CM | POA: Diagnosis not present

## 2022-03-25 DIAGNOSIS — I10 Essential (primary) hypertension: Secondary | ICD-10-CM | POA: Diagnosis not present

## 2022-03-25 DIAGNOSIS — Z6829 Body mass index (BMI) 29.0-29.9, adult: Secondary | ICD-10-CM | POA: Diagnosis not present

## 2022-03-25 DIAGNOSIS — E785 Hyperlipidemia, unspecified: Secondary | ICD-10-CM | POA: Diagnosis not present

## 2022-04-03 DIAGNOSIS — Z139 Encounter for screening, unspecified: Secondary | ICD-10-CM | POA: Diagnosis not present

## 2022-04-10 ENCOUNTER — Ambulatory Visit: Payer: PPO | Attending: Cardiology | Admitting: Cardiology

## 2022-04-10 ENCOUNTER — Encounter: Payer: Self-pay | Admitting: Cardiology

## 2022-04-10 VITALS — BP 136/72 | HR 88 | Ht 64.0 in | Wt 176.0 lb

## 2022-04-10 DIAGNOSIS — I201 Angina pectoris with documented spasm: Secondary | ICD-10-CM | POA: Diagnosis not present

## 2022-04-10 DIAGNOSIS — E785 Hyperlipidemia, unspecified: Secondary | ICD-10-CM | POA: Diagnosis not present

## 2022-04-10 DIAGNOSIS — I10 Essential (primary) hypertension: Secondary | ICD-10-CM | POA: Diagnosis not present

## 2022-04-10 MED ORDER — NITROGLYCERIN 0.4 MG SL SUBL: 0.4000 mg | SUBLINGUAL_TABLET | SUBLINGUAL | 1 refills | Status: DC | PRN

## 2022-04-10 NOTE — Patient Instructions (Signed)
Medication Instructions:  Your physician recommends that you continue on your current medications as directed. Please refer to the Current Medication list given to you today.  *If you need a refill on your cardiac medications before your next appointment, please call your pharmacy*   Lab Work: None If you have labs (blood work) drawn today and your tests are completely normal, you will receive your results only by: [place] (if you have MyChart) OR A paper copy in the mail If you have any lab test that is abnormal or we need to change your treatment, we will call you to review the results.   Testing/Procedures: None   Follow-Up: At [facility], you and your health needs are our priority.  As part of our continuing mission to provide you with exceptional heart care, we have created designated Provider Care Teams.  These Care Teams include your primary Cardiologist (physician) and Advanced Practice Providers (APPs -  Physician Assistants and Nurse Practitioners) who all work together to provide you with the care you need, when you need it.  We recommend signing up for the patient portal called "MyChart".  Sign up information is provided on this After Visit Summary.  MyChart is used to connect with patients for Virtual Visits (Telemedicine).  Patients are able to view lab/test results, encounter notes, upcoming appointments, etc.  Non-urgent messages can be sent to your provider as well.   To learn more about what you can do with MyChart, go to [email or web address].    Your next appointment:   1 year(s)  The format for your next appointment:   In Person  Provider:   Shirlee More, MD    Other Instructions Check and record home blood pressures several times a week.  Goal is 8500 steps per day.  Important Information About Sugar

## 2022-04-10 NOTE — Progress Notes (Signed)
Cardiology Office Note:    Date:  04/10/2022   ID:  Kristin Stevenson, DOB Sep 06, 1955, MRN [id number]  PCP:  Marco Collie, MD  Cardiologist:  Shirlee More, MD    Referring MD: Marco Collie, MD    ASSESSMENT:    1. Coronary artery vasospasm ([place])   2. Essential hypertension   3. Dyslipidemia    PLAN:    In order of problems listed above:  Hipp is done well rarely has angina and was relieved with rest and nitroglycerin.  I do not think she requires a repeat cardiac CTA.  Will continue current medical for her pain including her calcium channel blocker aspirin and high intensity statin along with her antihypertensive  I have asked her to check blood pressure several times a week validated device good technique record and bring to visits with her son, but people at mass hypertension good in the office elevated at home and continue calcium channel blocker ACE inhibitor Continue her current statin   Next appointment: 1 year given a prescription new for nitroglycerin   Medication Adjustments/Labs and Tests Ordered: Current medicines are reviewed at length with the patient today.  Concerns regarding medicines are outlined above.  Orders Placed This Encounter  Procedures   EKG 12-Lead   Meds ordered this encounter  Medications   nitroGLYCERIN (NITROSTAT) 0.4 MG SL tablet    Sig: Place 1 tablet (0.4 mg total) under the tongue every 5 (five) minutes as needed for chest pain.    Dispense:  25 tablet    Refill:  1    Chief complaint follow-up coronary artery spasm   History of Present Illness:    Kristin Stevenson is a 66 y.o. female with a hx of non-ST elevation MI October 2015 with coronary artery vasospasm and normal coronary arteriography with focal apical hypokinesia hypertension and hyper lipidemia last seen 03/26/2021.  Compliance with diet, lifestyle and medications: Yes  Overall is done well but had 1 episode of angina while doing her checking account.  She is relieved with the  nitroglycerin and rest She has a good lifestyle she does about 5000 steps a day and intends to increase her activity and lose weight Home blood pressure is well-controlled Tolerates lipid-lowering therapy without muscle pain or weakness Not having exertional chest pain edema shortness of breath palpitation or syncope Past Medical History:  Diagnosis Date   Dyslipidemia 01/31/2015   Essential hypertension 01/31/2015   Hypercholesteremia    Hypertension    Non-STEMI (non-ST elevated myocardial infarction) ([place]) 01/31/2015   Overview:  Cardiac cath 02/19/14 with normal coronary arteriography mild apical hypokinesia EF 60%    Past Surgical History:  Procedure Laterality Date   CESAREAN SECTION  1981   COLONOSCOPY  05/27/2016   Colonic polyp status post polypectomy. Minimal sigmoid diverticulosis. Highly redundant colon   HYSTEROSCOPY WITH D & C  01/03/2012   Procedure: DILATATION AND CURETTAGE /HYSTEROSCOPY;  Surgeon: Selinda Orion, MD;  Location: [place];  Service: Gynecology;  Laterality: N/A;  WITH RESECTION OF FIBROIDS      REPEAT CESAREAN SECTION  1983   TONSILLECTOMY      Current Medications: Current Meds  Medication Sig   amLODipine (NORVASC) 5 MG tablet Take 1 tablet by mouth once daily   aspirin 81 MG tablet Take 81 mg by mouth daily.   Coenzyme Q10 (CO Q-10) 100 MG CAPS Take 100 mg by mouth daily.   D-Mannose 500 MG CAPS    diphenhydrAMINE (BENADRYL) 25 mg  capsule    escitalopram (LEXAPRO) 10 MG tablet Take 10 mg by mouth daily.   lisinopril (PRINIVIL,ZESTRIL) 20 MG tablet Take 20 mg by mouth daily.    Multiple Vitamins-Minerals (PRESERVISION AREDS 2+MULTI VIT PO) Take 1 tablet by mouth 2 (two) times daily.   Omega-3 Fatty Acids (FISH OIL) 1000 MG CPDR Take 1 capsule by mouth daily.   simvastatin (ZOCOR) 20 MG tablet Take 20 mg by mouth every evening.   TURMERIC PO Take 1,000 mg by mouth 2 (two) times daily.   [DISCONTINUED] nitroGLYCERIN (NITROSTAT)  0.4 MG SL tablet Place 1 tablet (0.4 mg total) under the tongue every 5 (five) minutes as needed.     Allergies:   Patient has no known allergies.   Social History   Socioeconomic History   Marital status: Married    Spouse name: Not on file   Number of children: Not on file   Years of education: Not on file   Highest education level: Not on file  Occupational History   Not on file  Tobacco Use   Smoking status: Never    Passive exposure: Never   Smokeless tobacco: Never  Vaping Use   Vaping Use: Never used  Substance and Sexual Activity   Alcohol use: Yes    Comment: occasional   Drug use: No   Sexual activity: Not on file  Other Topics Concern   Not on file  Social History Narrative   Not on file   Social Determinants of Health   Financial Resource Strain: Not on file  Food Insecurity: Not on file  Transportation Needs: Not on file  Physical Activity: Not on file  Stress: Not on file  Social Connections: Not on file     Family History: The patient's family history includes Colon cancer in her maternal grandmother; Diabetes in her mother; Hypertension in her mother and paternal uncle. There is no history of Colon polyps, Esophageal cancer, Stomach cancer, or Rectal cancer. ROS:   Please see the history of present illness.    All other systems reviewed and are negative.  EKGs/Labs/Other Studies Reviewed:    The following studies were reviewed today:  EKG:  EKG ordered today and personally reviewed.  The ekg ordered today demonstrates sinus rhythm and is normal  Recent Labs: 03/18/2022: Cholesterol 206 LDL 105 triglycerides 141 LDL 77 A1c 5.6% all good  Physical Exam:    VS:  BP 136/72 (BP Location: Right Arm, Patient Position: Sitting)   Pulse 88   Ht '5\' 4"'$  (1.626 m)   Wt 176 lb (79.8 kg)   SpO2 96%   BMI 30.21 kg/m     Wt Readings from Last 3 Encounters:  04/10/22 176 lb (79.8 kg)  08/07/21 177 lb (80.3 kg)  07/18/21 177 lb (80.3 kg)     GEN:   Well nourished, well developed in no acute distress HEENT: Normal NECK: No JVD; No carotid bruits LYMPHATICS: No lymphadenopathy CARDIAC: RRR, no murmurs, rubs, gallops RESPIRATORY:  Clear to auscultation without rales, wheezing or rhonchi  ABDOMEN: Soft, non-tender, non-distended MUSCULOSKELETAL:  No edema; No deformity  SKIN: Warm and dry NEUROLOGIC:  Alert and oriented x 3 PSYCHIATRIC:  Normal affect    Signed, Shirlee More, MD  04/10/2022 1:34 PM    [facility]

## 2022-04-13 DIAGNOSIS — J019 Acute sinusitis, unspecified: Secondary | ICD-10-CM | POA: Diagnosis not present

## 2022-08-20 DIAGNOSIS — I10 Essential (primary) hypertension: Secondary | ICD-10-CM | POA: Diagnosis not present

## 2022-08-20 DIAGNOSIS — R7303 Prediabetes: Secondary | ICD-10-CM | POA: Diagnosis not present

## 2022-08-20 DIAGNOSIS — E785 Hyperlipidemia, unspecified: Secondary | ICD-10-CM | POA: Diagnosis not present

## 2022-08-26 DIAGNOSIS — I10 Essential (primary) hypertension: Secondary | ICD-10-CM | POA: Diagnosis not present

## 2022-08-26 DIAGNOSIS — E785 Hyperlipidemia, unspecified: Secondary | ICD-10-CM | POA: Diagnosis not present

## 2022-08-26 DIAGNOSIS — Z6830 Body mass index (BMI) 30.0-30.9, adult: Secondary | ICD-10-CM | POA: Diagnosis not present

## 2022-08-26 DIAGNOSIS — R7303 Prediabetes: Secondary | ICD-10-CM | POA: Diagnosis not present

## 2022-08-26 DIAGNOSIS — Z1331 Encounter for screening for depression: Secondary | ICD-10-CM | POA: Diagnosis not present

## 2022-09-03 DIAGNOSIS — Z136 Encounter for screening for cardiovascular disorders: Secondary | ICD-10-CM | POA: Diagnosis not present

## 2022-09-03 DIAGNOSIS — Z713 Dietary counseling and surveillance: Secondary | ICD-10-CM | POA: Diagnosis not present

## 2022-09-28 ENCOUNTER — Other Ambulatory Visit: Payer: Self-pay | Admitting: Cardiology

## 2022-09-30 IMAGING — MG DIGITAL DIAGNOSTIC BILAT W/ TOMO W/ CAD
6 of 10 series · 6 of 30 positions shown · non-contrast
Comparison: Previous exam(s).

CLINICAL DATA: Patient complains of thickening in the left
inframammary fold.

EXAM:
DIGITAL DIAGNOSTIC BILATERAL MAMMOGRAM WITH TOMOSYNTHESIS AND CAD;
ULTRASOUND LEFT BREAST LIMITED
TECHNIQUE: Bilateral digital diagnostic mammography and breast tomosynthesis
was performed. The images were evaluated with computer-aided
detection.; Targeted ultrasound examination of the left breast was
performed

[L MLO synth-2D (1 of 2)]
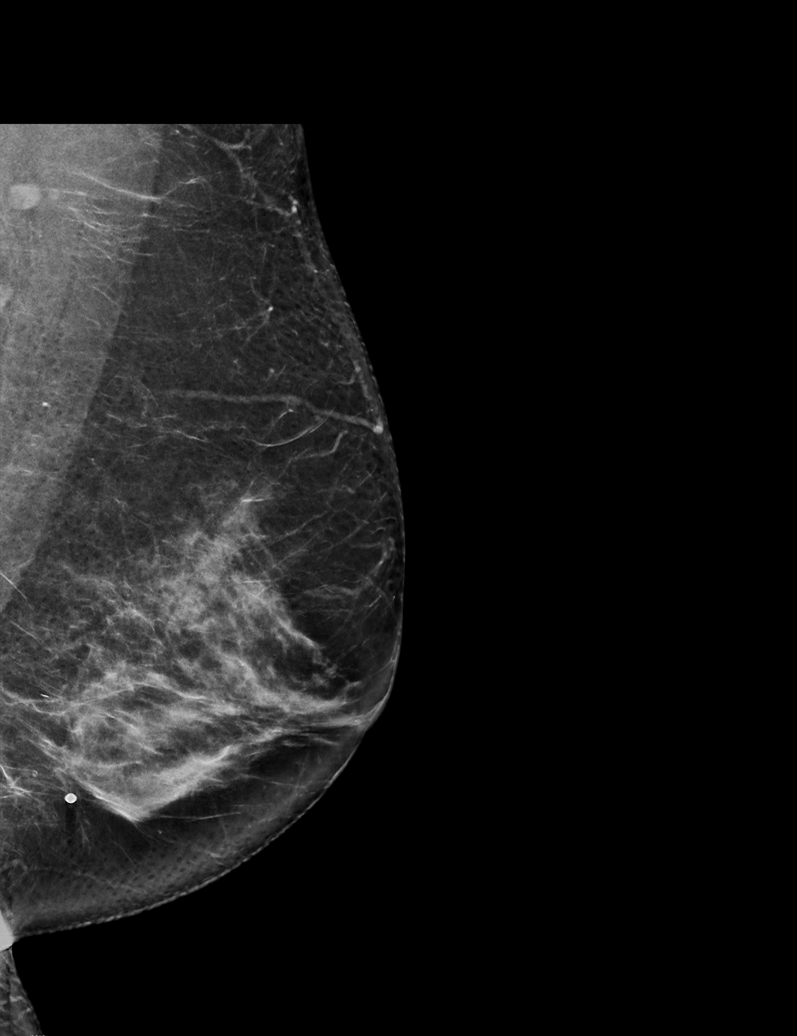

[R MLO synth-2D]
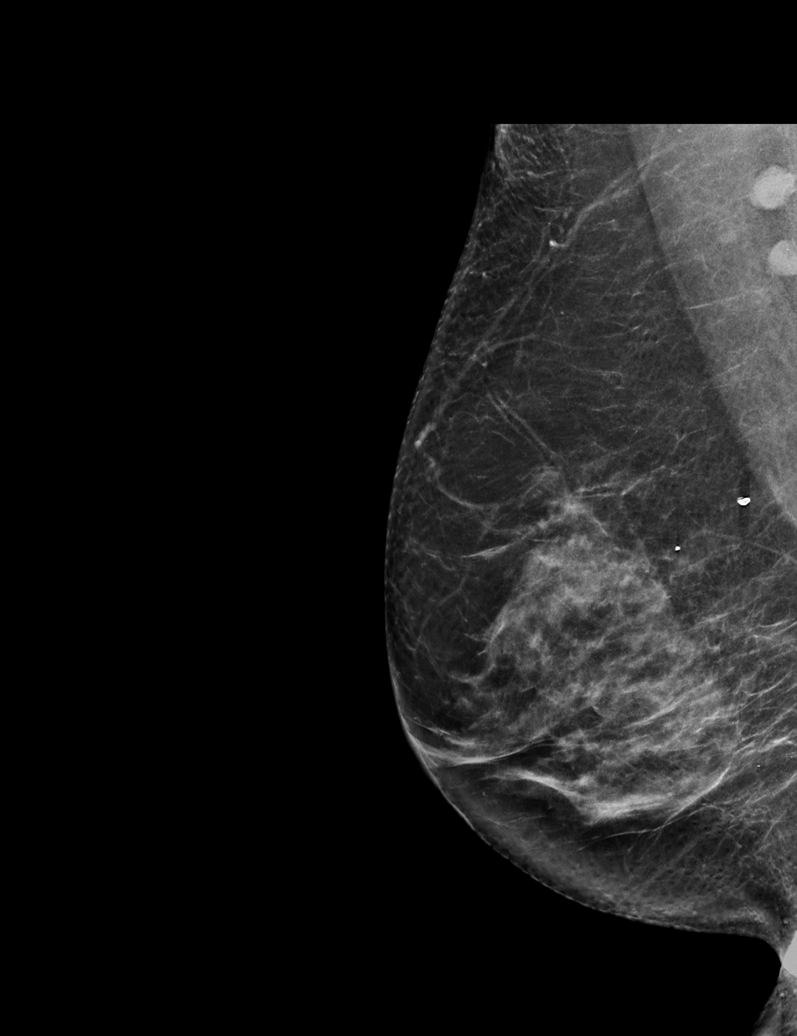

[L MLO synth-2D (2 of 2)]
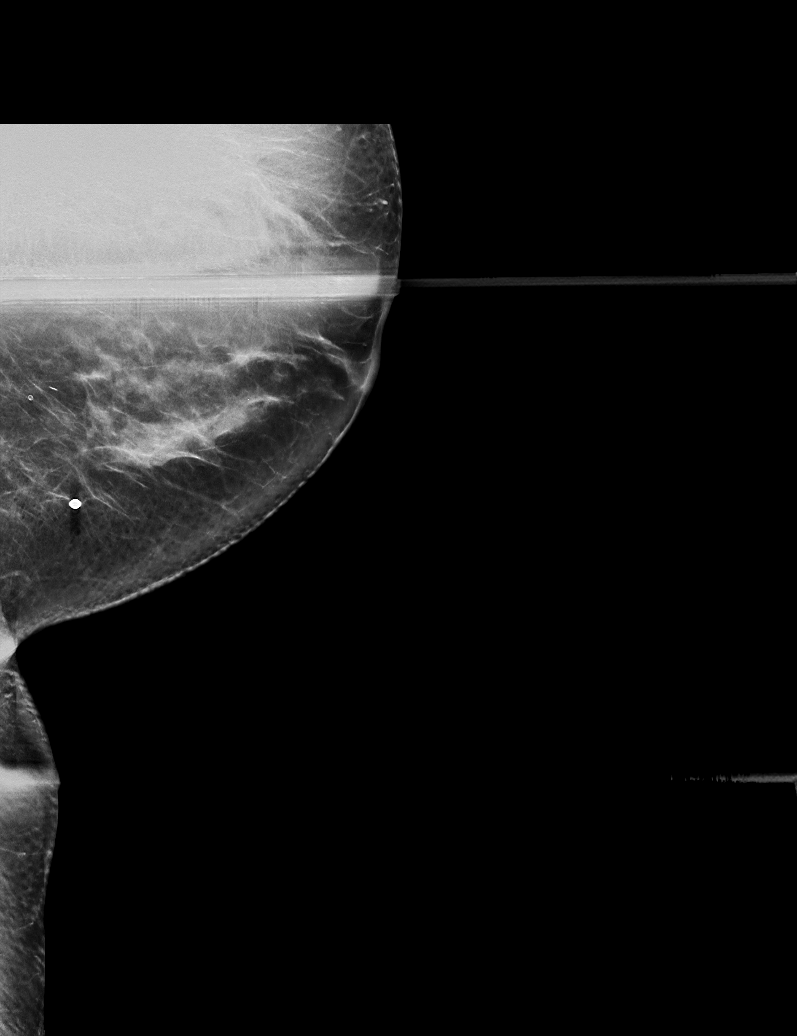

[L CC synth-2D]
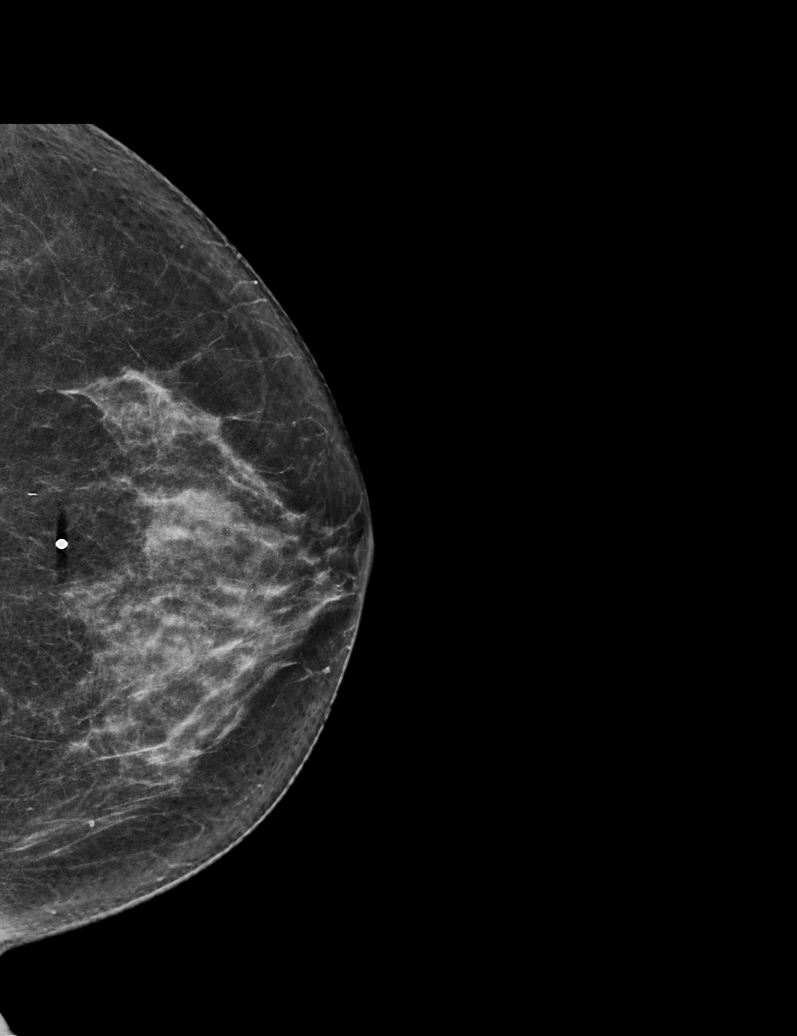

[R CC synth-2D]
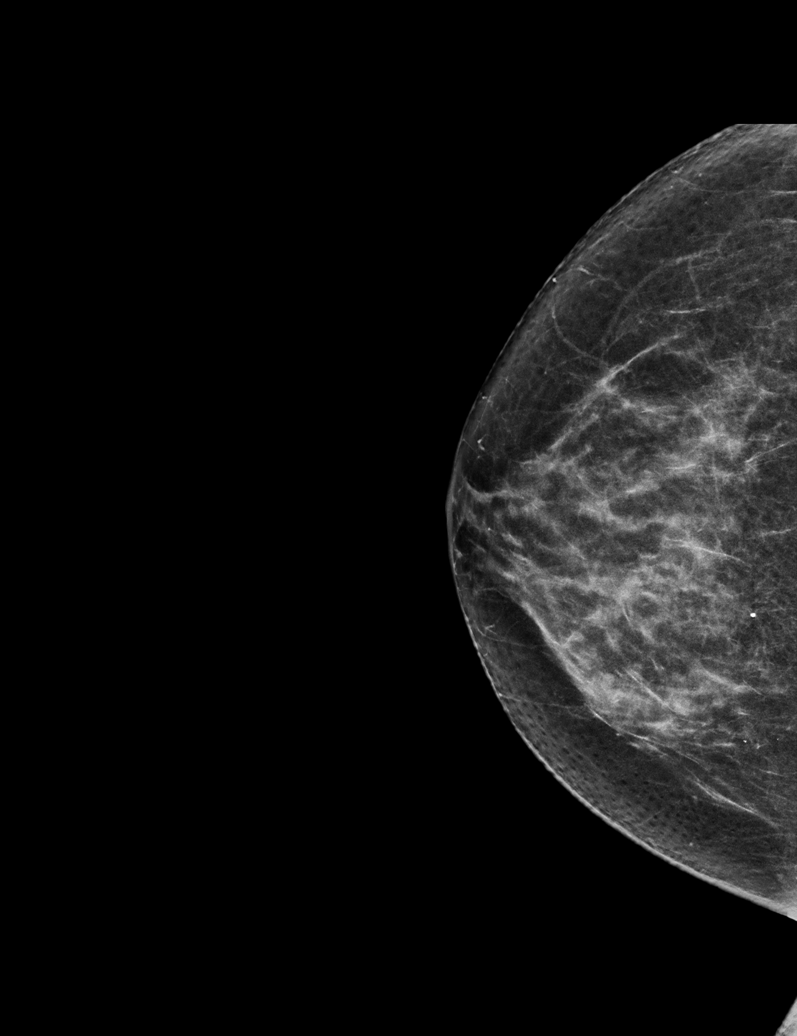

[L CC tomo · tomo slice 34/67.0]
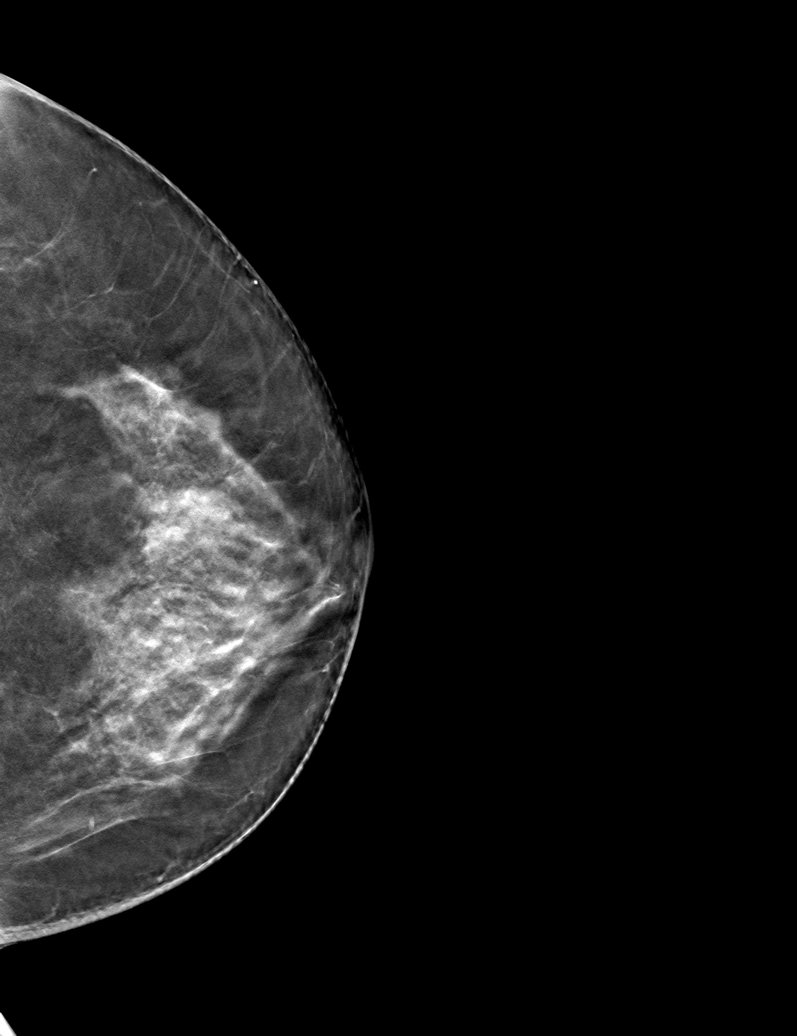

[6 of 30 positions shown; findings below may reference images not displayed]

ACR Breast Density Category c: The breast tissue is heterogeneously
dense, which may obscure small masses.
FINDINGS: No suspicious mass, malignant type microcalcifications or distortion
detected in either breast. Spot tangential view of the left
inframammary fold shows normal dense fibroglandular tissue.

On physical exam, I do not palpate a discrete mass in the
inframammary fold of the left breast.

Targeted ultrasound is performed, showing normal tissue in the
palpable area of concern in the left inframammary fold. No solid or
cystic mass, abnormal shadowing or distortion visualized.
IMPRESSION: No evidence of malignancy in either breast.

RECOMMENDATION:
Bilateral screening mammogram in 1 year is recommended.

I have discussed the findings and recommendations with the patient.
If applicable, a reminder letter will be sent to the patient
regarding the next appointment.

BI-RADS CATEGORY  1: Negative.

## 2022-10-01 ENCOUNTER — Encounter: Payer: Self-pay | Admitting: *Deleted

## 2022-10-01 DIAGNOSIS — N952 Postmenopausal atrophic vaginitis: Secondary | ICD-10-CM | POA: Insufficient documentation

## 2022-10-01 DIAGNOSIS — N898 Other specified noninflammatory disorders of vagina: Secondary | ICD-10-CM

## 2022-10-01 DIAGNOSIS — R319 Hematuria, unspecified: Secondary | ICD-10-CM

## 2022-10-01 HISTORY — DX: Other specified noninflammatory disorders of vagina: N89.8

## 2022-10-01 HISTORY — DX: Hematuria, unspecified: R31.9

## 2022-10-01 HISTORY — DX: Postmenopausal atrophic vaginitis: N95.2

## 2022-11-14 ENCOUNTER — Other Ambulatory Visit: Payer: Self-pay | Admitting: Family Medicine

## 2022-11-14 DIAGNOSIS — Z1231 Encounter for screening mammogram for malignant neoplasm of breast: Secondary | ICD-10-CM

## 2022-11-20 ENCOUNTER — Ambulatory Visit
Admission: RE | Admit: 2022-11-20 | Discharge: 2022-11-20 | Disposition: A | Discharge status: Home / Self Care | Payer: PPO | Source: Ambulatory Visit | Attending: Family Medicine | Admitting: Family Medicine

## 2022-11-20 DIAGNOSIS — Z1231 Encounter for screening mammogram for malignant neoplasm of breast: Secondary | ICD-10-CM | POA: Diagnosis not present

## 2022-12-23 DIAGNOSIS — R7303 Prediabetes: Secondary | ICD-10-CM | POA: Diagnosis not present

## 2022-12-23 DIAGNOSIS — E785 Hyperlipidemia, unspecified: Secondary | ICD-10-CM | POA: Diagnosis not present

## 2022-12-30 DIAGNOSIS — E785 Hyperlipidemia, unspecified: Secondary | ICD-10-CM | POA: Diagnosis not present

## 2022-12-30 DIAGNOSIS — Z6829 Body mass index (BMI) 29.0-29.9, adult: Secondary | ICD-10-CM | POA: Diagnosis not present

## 2022-12-30 DIAGNOSIS — R7303 Prediabetes: Secondary | ICD-10-CM | POA: Diagnosis not present

## 2022-12-30 DIAGNOSIS — I10 Essential (primary) hypertension: Secondary | ICD-10-CM | POA: Diagnosis not present

## 2023-01-14 DIAGNOSIS — Z139 Encounter for screening, unspecified: Secondary | ICD-10-CM | POA: Diagnosis not present

## 2023-01-14 DIAGNOSIS — Z23 Encounter for immunization: Secondary | ICD-10-CM | POA: Diagnosis not present

## 2023-01-14 DIAGNOSIS — Z1389 Encounter for screening for other disorder: Secondary | ICD-10-CM | POA: Diagnosis not present

## 2023-01-14 DIAGNOSIS — Z136 Encounter for screening for cardiovascular disorders: Secondary | ICD-10-CM | POA: Diagnosis not present

## 2023-01-14 DIAGNOSIS — Z1339 Encounter for screening examination for other mental health and behavioral disorders: Secondary | ICD-10-CM | POA: Diagnosis not present

## 2023-01-14 DIAGNOSIS — Z0000 Encounter for general adult medical examination without abnormal findings: Secondary | ICD-10-CM | POA: Diagnosis not present

## 2023-01-14 DIAGNOSIS — Z1331 Encounter for screening for depression: Secondary | ICD-10-CM | POA: Diagnosis not present

## 2023-01-30 DIAGNOSIS — M8588 Other specified disorders of bone density and structure, other site: Secondary | ICD-10-CM | POA: Diagnosis not present

## 2023-01-30 DIAGNOSIS — E2839 Other primary ovarian failure: Secondary | ICD-10-CM | POA: Diagnosis not present

## 2023-01-30 DIAGNOSIS — M8580 Other specified disorders of bone density and structure, unspecified site: Secondary | ICD-10-CM | POA: Diagnosis not present

## 2023-03-20 DIAGNOSIS — R519 Headache, unspecified: Secondary | ICD-10-CM | POA: Diagnosis not present

## 2023-03-20 DIAGNOSIS — R051 Acute cough: Secondary | ICD-10-CM | POA: Diagnosis not present

## 2023-03-20 DIAGNOSIS — R0981 Nasal congestion: Secondary | ICD-10-CM | POA: Diagnosis not present

## 2023-03-20 DIAGNOSIS — R509 Fever, unspecified: Secondary | ICD-10-CM | POA: Diagnosis not present

## 2023-03-20 DIAGNOSIS — R070 Pain in throat: Secondary | ICD-10-CM | POA: Diagnosis not present

## 2023-03-24 ENCOUNTER — Other Ambulatory Visit: Payer: Self-pay | Admitting: Cardiology

## 2023-04-11 ENCOUNTER — Other Ambulatory Visit: Payer: Self-pay

## 2023-04-13 NOTE — Progress Notes (Unsigned)
Cardiology Office Note:    Date:  04/14/2023   ID:  Kristin Stevenson, DOB [birth year], MRN [id number]  PCP:  Abner Greenspan, MD  Cardiologist:  Norman Herrlich, MD    Referring MD: Abner Greenspan, MD    ASSESSMENT:    1. Coronary artery vasospasm (H[facility])   2. Dyslipidemia   3. Essential hypertension    PLAN:    In order of problems listed above:  Stable CAD coronary vasospasm rare nonexertional angina continue current treatment including aspirin calcium channel blocker lipid-lowering statin and nitroglycerin if needed.  I do not think she needs a repeat ischemia evaluation Continue her current statin well-tolerated Well-controlled combination ACE inhibitor calcium channel blocker continue the same   Next appointment: 1 year   Medication Adjustments/Labs and Tests Ordered: Current medicines are reviewed at length with the patient today.  Concerns regarding medicines are outlined above.  Orders Placed This Encounter  Procedures   EKG 12-Lead   No orders of the defined types were placed in this encounter.    History of Present Illness:    Kristin Stevenson is a 67 y.o. female with a hx of ACS October 2015 with coronary artery vasospasm normal coronary arteriography hypertension and hyperlipidemia last seen 04/10/2022.  Lipid profile 08/21/2022 cholesterol 193 LDL 92 non-HDL cholesterol 113 CMP at that time was normal creatinine 0.76 GFR 85 cc/min and normal liver function test.  Compliance with diet, lifestyle and medications: Yes  Continues to do well she has rare episodes of angina relieved with nitroglycerin precipitated by stress Otherwise no exertional chest pain edema shortness of breath palpitation or syncope He tolerates her statin without muscle pain or weakness Had a mild case of COVID this year Past Medical History:  Diagnosis Date   Arthritis 05/25/2021   Atrophic vaginitis 10/01/2022   Blood in urine 10/01/2022   Coronary artery vasospasm (HCC) 02/27/2017    Dyslipidemia 01/31/2015   Essential hypertension 01/31/2015   Hypercholesteremia    Hypertension    Menopausal symptom 07/31/2016   Non-STEMI (non-ST elevated myocardial infarction) ([facility]) 01/31/2015   Overview:  Cardiac cath 02/19/14 with normal coronary arteriography mild apical hypokinesia EF 60%   Vaginal irritation 10/01/2022    Current Medications: Current Meds  Medication Sig   amLODipine (NORVASC) 5 MG tablet Take 1 tablet (5 mg total) by mouth daily.   aspirin 81 MG tablet Take 81 mg by mouth daily.   Coenzyme Q10 (CO Q-10) 100 MG CAPS Take 100 mg by mouth daily.   D-Mannose 500 MG CAPS Take 500 mg by mouth daily.   diphenhydrAMINE (BENADRYL) 25 mg capsule Take 25 mg by mouth as needed for itching, allergies or sleep.   escitalopram (LEXAPRO) 10 MG tablet Take 10 mg by mouth daily.   lisinopril (PRINIVIL,ZESTRIL) 20 MG tablet Take 20 mg by mouth daily.    Multiple Vitamins-Minerals (PRESERVISION AREDS 2+MULTI VIT PO) Take 1 tablet by mouth 2 (two) times daily.   nitroGLYCERIN (NITROSTAT) 0.4 MG SL tablet Place 1 tablet (0.4 mg total) under the tongue every 5 (five) minutes as needed for chest pain.   Omega-3 Fatty Acids (FISH OIL) 1000 MG CPDR Take 1 capsule by mouth daily.   simvastatin (ZOCOR) 20 MG tablet Take 20 mg by mouth every evening.   TURMERIC PO Take 1,000 mg by mouth 2 (two) times daily.      EKGs/Labs/Other Studies Reviewed:    The following studies were reviewed today:  EKG Interpretation Date/Time:  Monday April 14 2023 15:02:38 EST  Ventricular Rate:  87 PR Interval:  184 QRS Duration:  50 QT Interval:  348 QTC Calculation: 418 R Axis:   64  Text Interpretation: Normal sinus rhythm Normal ECG When compared with ECG of 03-Jan-2012 07:03,unchanged Confirmed by Norman Herrlich ([id number]) on 04/14/2023 2:05:15 PM   Recent Labs: Laboratory studies August 2024 cholesterol 196 LDL 92 creatinine 0.75 potassium 4.6  Physical Exam:    VS:  BP (!) 122/58    Pulse 87   Ht 5\' 3"  (1.6 m)   Wt 176 lb 12.8 oz (80.2 kg)   SpO2 96%   BMI 31.32 kg/m     Wt Readings from Last 3 Encounters:  04/14/23 176 lb 12.8 oz (80.2 kg)  04/10/22 176 lb (79.8 kg)  08/07/21 177 lb (80.3 kg)     GEN: She has no xanthoma xanthelasma or arcus senilis well nourished, well developed in no acute distress HEENT: Normal NECK: No JVD; No carotid bruits LYMPHATICS: No lymphadenopathy CARDIAC: RRR, no murmurs, rubs, gallops RESPIRATORY:  Clear to auscultation without rales, wheezing or rhonchi  ABDOMEN: Soft, non-tender, non-distended MUSCULOSKELETAL:  No edema; No deformity  SKIN: Warm and dry NEUROLOGIC:  Alert and oriented x 3 PSYCHIATRIC:  Normal affect    Signed, Norman Herrlich, MD  04/14/2023 2:19 PM    [facility]

## 2023-04-14 ENCOUNTER — Ambulatory Visit: Payer: PPO | Attending: Cardiology | Admitting: Cardiology

## 2023-04-14 VITALS — BP 122/58 | HR 87 | Ht 63.0 in | Wt 176.8 lb

## 2023-04-14 DIAGNOSIS — E785 Hyperlipidemia, unspecified: Secondary | ICD-10-CM

## 2023-04-14 DIAGNOSIS — I201 Angina pectoris with documented spasm: Secondary | ICD-10-CM

## 2023-04-14 DIAGNOSIS — I10 Essential (primary) hypertension: Secondary | ICD-10-CM

## 2023-04-14 MED ORDER — NITROGLYCERIN 0.4 MG SL SUBL: 0.4000 mg | SUBLINGUAL_TABLET | SUBLINGUAL | 2 refills | Status: DC | PRN

## 2023-04-14 MED ORDER — NITROGLYCERIN 0.4 MG SL SUBL: 0.4000 mg | SUBLINGUAL_TABLET | SUBLINGUAL | 11 refills | Status: DC | PRN

## 2023-04-14 NOTE — Patient Instructions (Signed)

## 2023-05-05 ENCOUNTER — Other Ambulatory Visit: Payer: Self-pay | Admitting: Cardiology

## 2023-05-19 DIAGNOSIS — E785 Hyperlipidemia, unspecified: Secondary | ICD-10-CM | POA: Diagnosis not present

## 2023-05-19 DIAGNOSIS — R7303 Prediabetes: Secondary | ICD-10-CM | POA: Diagnosis not present

## 2023-05-26 DIAGNOSIS — E785 Hyperlipidemia, unspecified: Secondary | ICD-10-CM | POA: Diagnosis not present

## 2023-05-26 DIAGNOSIS — I10 Essential (primary) hypertension: Secondary | ICD-10-CM | POA: Diagnosis not present

## 2023-05-26 DIAGNOSIS — Z6831 Body mass index (BMI) 31.0-31.9, adult: Secondary | ICD-10-CM | POA: Diagnosis not present

## 2023-05-26 DIAGNOSIS — R7303 Prediabetes: Secondary | ICD-10-CM | POA: Diagnosis not present

## 2023-10-21 DIAGNOSIS — Z1231 Encounter for screening mammogram for malignant neoplasm of breast: Secondary | ICD-10-CM | POA: Diagnosis not present

## 2023-10-21 DIAGNOSIS — Z01419 Encounter for gynecological examination (general) (routine) without abnormal findings: Secondary | ICD-10-CM | POA: Diagnosis not present

## 2023-10-21 DIAGNOSIS — E785 Hyperlipidemia, unspecified: Secondary | ICD-10-CM | POA: Diagnosis not present

## 2023-10-21 DIAGNOSIS — R7303 Prediabetes: Secondary | ICD-10-CM | POA: Diagnosis not present

## 2023-10-21 DIAGNOSIS — Z780 Asymptomatic menopausal state: Secondary | ICD-10-CM | POA: Diagnosis not present

## 2023-10-27 DIAGNOSIS — Z6830 Body mass index (BMI) 30.0-30.9, adult: Secondary | ICD-10-CM | POA: Diagnosis not present

## 2023-10-27 DIAGNOSIS — I10 Essential (primary) hypertension: Secondary | ICD-10-CM | POA: Diagnosis not present

## 2023-10-27 DIAGNOSIS — Z1331 Encounter for screening for depression: Secondary | ICD-10-CM | POA: Diagnosis not present

## 2023-10-27 DIAGNOSIS — E785 Hyperlipidemia, unspecified: Secondary | ICD-10-CM | POA: Diagnosis not present

## 2023-10-27 DIAGNOSIS — Z9181 History of falling: Secondary | ICD-10-CM | POA: Diagnosis not present

## 2023-10-27 DIAGNOSIS — R7303 Prediabetes: Secondary | ICD-10-CM | POA: Diagnosis not present

## 2024-02-03 DIAGNOSIS — Z1339 Encounter for screening examination for other mental health and behavioral disorders: Secondary | ICD-10-CM | POA: Diagnosis not present

## 2024-02-03 DIAGNOSIS — Z1389 Encounter for screening for other disorder: Secondary | ICD-10-CM | POA: Diagnosis not present

## 2024-02-03 DIAGNOSIS — Z0000 Encounter for general adult medical examination without abnormal findings: Secondary | ICD-10-CM | POA: Diagnosis not present

## 2024-02-03 DIAGNOSIS — Z136 Encounter for screening for cardiovascular disorders: Secondary | ICD-10-CM | POA: Diagnosis not present

## 2024-02-03 DIAGNOSIS — Z1331 Encounter for screening for depression: Secondary | ICD-10-CM | POA: Diagnosis not present

## 2024-02-03 DIAGNOSIS — Z139 Encounter for screening, unspecified: Secondary | ICD-10-CM | POA: Diagnosis not present

## 2024-02-06 ENCOUNTER — Encounter (HOSPITAL_BASED_OUTPATIENT_CLINIC_OR_DEPARTMENT_OTHER): Payer: Self-pay

## 2024-02-06 ENCOUNTER — Other Ambulatory Visit (HOSPITAL_BASED_OUTPATIENT_CLINIC_OR_DEPARTMENT_OTHER): Payer: Self-pay

## 2024-02-06 ENCOUNTER — Ambulatory Visit (HOSPITAL_BASED_OUTPATIENT_CLINIC_OR_DEPARTMENT_OTHER): Admission: EM | Admit: 2024-02-06 | Discharge: 2024-02-06 | Disposition: A | Discharge status: Home / Self Care

## 2024-02-06 DIAGNOSIS — J069 Acute upper respiratory infection, unspecified: Secondary | ICD-10-CM | POA: Diagnosis not present

## 2024-02-06 DIAGNOSIS — R509 Fever, unspecified: Secondary | ICD-10-CM

## 2024-02-06 DIAGNOSIS — R051 Acute cough: Secondary | ICD-10-CM | POA: Diagnosis not present

## 2024-02-06 LAB — POC SOFIA SARS ANTIGEN FIA: SARS Coronavirus 2 Ag: NEGATIVE

## 2024-02-06 MED ORDER — PROMETHAZINE-DM 6.25-15 MG/5ML PO SYRP
5.0000 mL | ORAL_SOLUTION | Freq: Four times a day (QID) | ORAL | 0 refills | Status: DC | PRN
  Filled 2024-02-06: qty 118, 6d supply, fill #0

## 2024-02-06 NOTE — ED Triage Notes (Addendum)
 Patient presents to UC for tickle in throat since Tuesday. Weds she developed low grade fever, cough, and congestion. Treating with Coricidin and zicam. Negative at home covid test yesterday.

## 2024-02-06 NOTE — ED Provider Notes (Signed)
 [facility]    CSN: [id number] Arrival date & time: 02/06/24  1321      History   Chief Complaint Chief Complaint  Patient presents with   Fever   Cough    HPI Kristin Stevenson is a 68 y.o. female.   68 year old female with typical in her throat on Tuesday, 02/03/2024.  On Wednesday, 02/04/2024, she developed a low-grade fever, cough, head and a little upper chest congestion.  She took a COVID test on Thursday, 02/05/24, and it was negative.  She has used Coricidin and Zicam but no significant relief of symptoms.   Fever Associated symptoms: congestion, cough and rhinorrhea   Associated symptoms: no chest pain, no chills, no diarrhea, no dysuria, no ear pain, no nausea, no rash, no sore throat and no vomiting   Cough Associated symptoms: fever and rhinorrhea   Associated symptoms: no chest pain, no chills, no ear pain, no rash, no shortness of breath and no sore throat     Past Medical History:  Diagnosis Date   Arthritis 05/25/2021   Atrophic vaginitis 10/01/2022   Blood in urine 10/01/2022   Coronary artery vasospasm 02/27/2017   Dyslipidemia 01/31/2015   Essential hypertension 01/31/2015   Hypercholesteremia    Hypertension    Menopausal symptom 07/31/2016   Non-STEMI (non-ST elevated myocardial infarction) ([facility]CC) 01/31/2015   Overview:  Cardiac cath 02/19/14 with normal coronary arteriography mild apical hypokinesia EF 60%   Vaginal irritation 10/01/2022    Patient Active Problem List   Diagnosis Date Noted   Atrophic vaginitis 10/01/2022   Blood in urine 10/01/2022   Vaginal irritation 10/01/2022   Arthritis 05/25/2021   Hypercholesteremia    Hypertension    Coronary artery vasospasm 02/27/2017   Menopausal symptom 07/31/2016   Dyslipidemia 01/31/2015   Essential hypertension 01/31/2015   Non-STEMI (non-ST elevated myocardial infarction) ([facility]) 01/31/2015    Past Surgical History:  Procedure Laterality Date   CESAREAN SECTION  1981   COLONOSCOPY   05/27/2016   Colonic polyp status post polypectomy. Minimal sigmoid diverticulosis. Highly redundant colon   HYSTEROSCOPY WITH D & C  01/03/2012   Procedure: DILATATION AND CURETTAGE /HYSTEROSCOPY;  Surgeon: Carlin LELON Forbes, MD;  Location: [facility] [place];  Service: Gynecology;  Laterality: N/A;  WITH RESECTION OF FIBROIDS      REPEAT CESAREAN SECTION  1983   TONSILLECTOMY      OB History   No obstetric history on file.      Home Medications    Prior to Admission medications   Medication Sig Start Date End Date Taking? Authorizing Provider  Estradiol (ESTRACE VA) Place vaginally.   Yes [provider]  promethazine -dextromethorphan (PROMETHAZINE -DM) 6.25-15 MG/5ML syrup Take 5 mLs by mouth 4 (four) times daily as needed for cough. Do not use and drive - May make drowsy. 02/06/24  Yes Ival Domino, FNP  amLODipine  (NORVASC ) 5 MG tablet Take 1 tablet by mouth once daily 05/05/23   Monetta Redell PARAS, MD  aspirin 81 MG tablet Take 81 mg by mouth daily.    [provider]  Coenzyme Q10 (CO Q-10) 100 MG CAPS Take 100 mg by mouth daily.    [provider]  D-Mannose 500 MG CAPS Take 500 mg by mouth daily. 05/25/21   [provider]  D-Mannose 500 MG CAPS     [provider]  diphenhydrAMINE (BENADRYL) 25 mg capsule Take 25 mg by mouth as needed for itching, allergies or sleep.    [provider]  escitalopram (LEXAPRO) 10 MG tablet Take 10 mg by mouth daily. 03/03/20   [provider]  lisinopril (PRINIVIL,ZESTRIL) 20 MG tablet Take 20 mg by mouth daily.     [provider]  Multiple Vitamins-Minerals (PRESERVISION AREDS 2+MULTI VIT PO) Take 1 tablet by mouth 2 (two) times daily.    [provider]  nitroGLYCERIN  (NITROSTAT ) 0.4 MG SL tablet Place 1 tablet (0.4 mg total) under the tongue every 5 (five) minutes as needed for chest pain. 04/14/23   Monetta Redell PARAS, MD  Omega-3 Fatty Acids (FISH OIL) 1000  MG CPDR Take 1 capsule by mouth daily.    [provider]  simvastatin (ZOCOR) 20 MG tablet Take 20 mg by mouth every evening.    [provider]  TURMERIC PO Take 1,000 mg by mouth 2 (two) times daily.    [provider]    Family History Family History  Problem Relation Age of Onset   Hypertension Mother    Diabetes Mother    Hypertension Paternal Uncle    Colon cancer Maternal Grandmother    Colon polyps Neg Hx    Esophageal cancer Neg Hx    Stomach cancer Neg Hx    Rectal cancer Neg Hx     Social History Social History   Tobacco Use   Smoking status: Never    Passive exposure: Never   Smokeless tobacco: Never  Vaping Use   Vaping status: Never Used  Substance Use Topics   Alcohol  use: Yes    Comment: occasional   Drug use: No     Allergies   Patient has no known allergies.   Review of Systems Review of Systems  Constitutional:  Positive for fever. Negative for chills.  HENT:  Positive for congestion, postnasal drip and rhinorrhea. Negative for ear pain and sore throat.   Eyes:  Negative for pain and visual disturbance.  Respiratory:  Positive for cough. Negative for shortness of breath.   Cardiovascular:  Negative for chest pain and palpitations.  Gastrointestinal:  Negative for abdominal pain, constipation, diarrhea, nausea and vomiting.  Genitourinary:  Negative for dysuria and hematuria.  Musculoskeletal:  Negative for arthralgias and back pain.  Skin:  Negative for color change and rash.  Neurological:  Negative for seizures and syncope.  All other systems reviewed and are negative.    Physical Exam Triage Vital Signs ED Triage Vitals  Encounter Vitals Group     BP 02/06/24 1343 127/82     Girls Systolic BP Percentile --      Girls Diastolic BP Percentile --      Boys Systolic BP Percentile --      Boys Diastolic BP Percentile --      Pulse Rate 02/06/24 1343 82     Resp 02/06/24 1343 16     Temp 02/06/24 1343 98.4 F  (36.9 C)     Temp Source 02/06/24 1343 Oral     SpO2 02/06/24 1343 94 %     Weight --      Height --      Head Circumference --      Peak Flow --      Pain Score 02/06/24 1339 0     Pain Loc --      Pain Education --      Exclude from Growth Chart --    No data found.  Updated Vital Signs BP 127/82 (BP Location: Right Arm)   Pulse 82   Temp  98.4 F (36.9 C) (Oral)   Resp 16   SpO2 94%   Visual Acuity Right Eye Distance:   Left Eye Distance:   Bilateral Distance:    Right Eye Near:   Left Eye Near:    Bilateral Near:     Physical Exam Vitals and nursing note reviewed.  Constitutional:      General: She is not in acute distress.    Appearance: She is well-developed. She is not ill-appearing or toxic-appearing.  HENT:     Head: Normocephalic and atraumatic.     Right Ear: Hearing, tympanic membrane, ear canal and external ear normal.     Left Ear: Hearing, tympanic membrane, ear canal and external ear normal.     Nose: Congestion and rhinorrhea present. Rhinorrhea is clear.     Right Sinus: No maxillary sinus tenderness or frontal sinus tenderness.     Left Sinus: No maxillary sinus tenderness or frontal sinus tenderness.     Mouth/Throat:     Lips: Pink.     Mouth: Mucous membranes are moist.     Pharynx: Uvula midline. No oropharyngeal exudate or posterior oropharyngeal erythema.     Tonsils: No tonsillar exudate.  Eyes:     Conjunctiva/sclera: Conjunctivae normal.     Pupils: Pupils are equal, round, and reactive to light.  Cardiovascular:     Rate and Rhythm: Normal rate and regular rhythm.     Heart sounds: S1 normal and S2 normal. No murmur heard. Pulmonary:     Effort: Pulmonary effort is normal. No respiratory distress.     Breath sounds: Normal breath sounds. No decreased breath sounds, wheezing, rhonchi or rales.  Abdominal:     General: Bowel sounds are normal.     Palpations: Abdomen is soft.     Tenderness: There is no abdominal tenderness.   Musculoskeletal:        General: No swelling.     Cervical back: Neck supple.  Lymphadenopathy:     Head:     Right side of head: No submental, submandibular, tonsillar, preauricular or posterior auricular adenopathy.     Left side of head: No submental, submandibular, tonsillar, preauricular or posterior auricular adenopathy.     Cervical: No cervical adenopathy.     Right cervical: No superficial cervical adenopathy.    Left cervical: No superficial cervical adenopathy.  Skin:    General: Skin is warm and dry.     Capillary Refill: Capillary refill takes less than 2 seconds.     Findings: No rash.  Neurological:     Mental Status: She is alert and oriented to person, place, and time.  Psychiatric:        Mood and Affect: Mood normal.      UC Treatments / Results  Labs (all labs ordered are listed, but only abnormal results are displayed) Labs Reviewed  POC SOFIA SARS ANTIGEN FIA - Normal    EKG   Radiology No results found.  Procedures Procedures (including critical care time)  Medications Ordered in UC Medications - No data to display  Initial Impression / Assessment and Plan / UC Course  I have reviewed the triage vital signs and the nursing notes.  Pertinent labs & imaging results that were available during my care of the patient were reviewed by me and considered in my medical decision making (see chart for details).  Plan of Care: Viral upper respiratory infection with fever and cough: Rapid COVID was negative.  Get plenty of fluids and  rest.  Promethazine  DM, 5 mL, every 6 hours if needed for cough.  Discussed possible use of steroids but patient wanted to hold off for now.  Follow-up if symptoms do not improve, worsen or new symptoms occur.  I reviewed the plan of care with the patient and/or the patient's guardian.  The patient and/or guardian had time to ask questions and acknowledged that the questions were answered.  I provided instruction on symptoms  or reasons to return here or to go to an ER, if symptoms/condition did not improve, worsened or if new symptoms occurred.  Final Clinical Impressions(s) / UC Diagnoses   Final diagnoses:  Acute cough  Fever, unspecified  Viral URI with cough     Discharge Instructions      Viral upper respiratory infection with fever and cough: Rapid COVID was negative.  Get plenty of fluids and rest.  Promethazine  DM, 5 mL, every 6 hours if needed for cough.  Discussed possible use of steroids but patient wanted to hold off for now.  Follow-up if symptoms do not improve, worsen or new symptoms occur.     ED Prescriptions     Medication Sig Dispense Auth. Provider   promethazine -dextromethorphan (PROMETHAZINE -DM) 6.25-15 MG/5ML syrup Take 5 mLs by mouth 4 (four) times daily as needed for cough. Do not use and drive - May make drowsy. 118 mL Ival Domino, FNP      PDMP not reviewed this encounter.   Jaslin, Novitski, FNP 02/06/24 1504

## 2024-02-06 NOTE — Discharge Instructions (Addendum)
 Viral upper respiratory infection with fever and cough: Rapid COVID was negative.  Get plenty of fluids and rest.  Promethazine  DM, 5 mL, every 6 hours if needed for cough.  Discussed possible use of steroids but patient wanted to hold off for now.  Follow-up if symptoms do not improve, worsen or new symptoms occur.

## 2024-02-25 DIAGNOSIS — Z23 Encounter for immunization: Secondary | ICD-10-CM | POA: Diagnosis not present

## 2024-03-22 DIAGNOSIS — R7303 Prediabetes: Secondary | ICD-10-CM | POA: Diagnosis not present

## 2024-03-29 ENCOUNTER — Other Ambulatory Visit: Payer: Self-pay

## 2024-03-29 DIAGNOSIS — I10 Essential (primary) hypertension: Secondary | ICD-10-CM | POA: Diagnosis not present

## 2024-03-29 DIAGNOSIS — N951 Menopausal and female climacteric states: Secondary | ICD-10-CM | POA: Diagnosis not present

## 2024-03-29 DIAGNOSIS — R7303 Prediabetes: Secondary | ICD-10-CM | POA: Diagnosis not present

## 2024-03-29 DIAGNOSIS — E785 Hyperlipidemia, unspecified: Secondary | ICD-10-CM | POA: Diagnosis not present

## 2024-03-29 DIAGNOSIS — Z6831 Body mass index (BMI) 31.0-31.9, adult: Secondary | ICD-10-CM | POA: Diagnosis not present

## 2024-03-29 NOTE — Progress Notes (Unsigned)
 Cardiology Office Note:    Date:  03/30/2024   ID:  Kristin Stevenson, DOB [birth year], MRN [id number]  PCP:  Trinidad Hun, MD  Cardiologist:  Redell Leiter, MD    Referring MD: Trinidad Hun, MD   please do an APO B with her next lipid profile for optimization I like to see it less than 40-45   ASSESSMENT:    1. Coronary artery vasospasm   2. Essential hypertension   3. Dyslipidemia    PLAN:    In order of problems listed above:  Ferraiolo continues to do well occasionally takes a nitroglycerin  perhaps once every 2-month always has proper relief and her symptoms are not exertional angina given a new prescription for nitroglycerin  Well-controlled continues follow home blood pressure in range and continue her current calcium channel blocker especially beneficial for coronary artery spasm along with lisinopril with endothelial effect Tolerates her statin well continue the same   Next appointment: 1 year   Medication Adjustments/Labs and Tests Ordered: Current medicines are reviewed at length with the patient today.  Concerns regarding medicines are outlined above.  Orders Placed This Encounter  Procedures   EKG 12-Lead   No orders of the defined types were placed in this encounter.    History of Present Illness:    Kristin Stevenson is a 68 y.o. female with a hx of coronary artery vasospasm with normal coronary arteriography at [facility] in October 2015 hypertension and hyperlipidemia last seen 04/14/2023.  Compliance with diet, lifestyle and medications: Yes  Doing well rare anginal discomfort nonexertional uses nitroglycerin  perhaps once or twice a year No exercise intolerance edema shortness of breath exertional chest pain palpitation or syncope Blood pressure is in range Tolerates her statin without muscle pain or  weakness Past Medical History:  Diagnosis Date   Arthritis 05/25/2021   Atrophic vaginitis 10/01/2022   Blood in urine 10/01/2022   Coronary artery vasospasm 02/27/2017    Dyslipidemia 01/31/2015   Essential hypertension 01/31/2015   Hypercholesteremia    Hypertension    Menopausal symptom 07/31/2016   Non-STEMI (non-ST elevated myocardial infarction) (HCC) 01/31/2015   Overview:  Cardiac cath 02/19/14 with normal coronary arteriography mild apical hypokinesia EF 60%   Vaginal irritation 10/01/2022    Current Medications: Current Meds  Medication Sig   amLODipine  (NORVASC ) 5 MG tablet Take 1 tablet by mouth once daily   aspirin 81 MG tablet Take 81 mg by mouth daily.   Coenzyme Q10 (CO Q-10) 100 MG CAPS Take 100 mg by mouth daily.   diphenhydrAMINE (BENADRYL) 25 mg capsule Take 25 mg by mouth as needed for itching, allergies or sleep.   escitalopram (LEXAPRO) 10 MG tablet Take 10 mg by mouth daily.   Estradiol (ESTRACE VA) Place vaginally.   lisinopril (PRINIVIL,ZESTRIL) 20 MG tablet Take 20 mg by mouth daily.    Multiple Vitamins-Minerals (PRESERVISION AREDS 2+MULTI VIT PO) Take 1 tablet by mouth 2 (two) times daily.   nitroGLYCERIN  (NITROSTAT ) 0.4 MG SL tablet Place 1 tablet (0.4 mg total) under the tongue every 5 (five) minutes as needed for chest pain.   Omega-3 Fatty Acids (FISH OIL) 1000 MG CPDR Take 1 capsule by mouth daily.   simvastatin (ZOCOR) 20 MG tablet Take 20 mg by mouth every evening.   TURMERIC PO Take 1,000 mg by mouth 2 (two) times daily.    EKG Interpretation Date/Time:  Tuesday March 30 2024 11:22:43 EST Ventricular Rate:  78 PR Interval:  196 QRS Duration:  70 QT Interval:  372 QTC Calculation: 424 R Axis:   77  Text Interpretation: Normal sinus rhythm Normal ECG When compared with ECG of 14-Apr-2023 15:02, No significant change was found Confirmed by Monetta Rogue ([id number]) on 03/30/2024 11:29:11 AM    EKGs/Labs/Other Studies Reviewed:    The following studies were reviewed today:  Recent labs 03/22/2024 cholesterol 186 LDL 89 non-HDL cholesterol 104 A1c 5.6 creatinine 0.75 potassium 4.2  Physical Exam:    VS:   BP 124/72   Pulse 78   Ht 5' 3 (1.6 m)   Wt 175 lb 3.2 oz (79.5 kg)   SpO2 96%   BMI 31.04 kg/m     Wt Readings from Last 3 Encounters:  03/30/24 175 lb 3.2 oz (79.5 kg)  04/14/23 176 lb 12.8 oz (80.2 kg)  04/10/22 176 lb (79.8 kg)     GEN:  Well nourished, well developed in no acute distress she has no arcus senilis xanthoma or xanthelasma HEENT: Normal NECK: No JVD; No carotid bruits LYMPHATICS: No lymphadenopathy CARDIAC: RRR, no murmurs, rubs, gallops RESPIRATORY:  Clear to auscultation without rales, wheezing or rhonchi  ABDOMEN: Soft, non-tender, non-distended MUSCULOSKELETAL:  No edema; No deformity  SKIN: Warm and dry NEUROLOGIC:  Alert and oriented x 3 PSYCHIATRIC:  Normal affect    Signed, Rogue Monetta, MD  03/30/2024 11:46 AM    [facility]

## 2024-03-30 ENCOUNTER — Ambulatory Visit: Attending: Cardiology | Admitting: Cardiology

## 2024-03-30 ENCOUNTER — Encounter: Payer: Self-pay | Admitting: Cardiology

## 2024-03-30 VITALS — BP 124/72 | HR 78 | Ht 63.0 in | Wt 175.2 lb

## 2024-03-30 DIAGNOSIS — I201 Angina pectoris with documented spasm: Secondary | ICD-10-CM

## 2024-03-30 DIAGNOSIS — E785 Hyperlipidemia, unspecified: Secondary | ICD-10-CM

## 2024-03-30 DIAGNOSIS — I10 Essential (primary) hypertension: Secondary | ICD-10-CM | POA: Diagnosis not present

## 2024-03-30 MED ORDER — NITROGLYCERIN 0.4 MG SL SUBL: 0.4000 mg | SUBLINGUAL_TABLET | SUBLINGUAL | 2 refills | Status: AC | PRN

## 2024-03-30 NOTE — Patient Instructions (Signed)

## 2024-04-09 ENCOUNTER — Encounter (HOSPITAL_BASED_OUTPATIENT_CLINIC_OR_DEPARTMENT_OTHER): Payer: Self-pay

## 2024-04-09 ENCOUNTER — Ambulatory Visit (HOSPITAL_BASED_OUTPATIENT_CLINIC_OR_DEPARTMENT_OTHER): Admit: 2024-04-09 | Discharge: 2024-04-09 | Disposition: A | Discharge status: Home / Self Care | Admitting: Radiology

## 2024-04-09 ENCOUNTER — Ambulatory Visit (HOSPITAL_BASED_OUTPATIENT_CLINIC_OR_DEPARTMENT_OTHER)
Admission: RE | Admit: 2024-04-09 | Discharge: 2024-04-09 | Disposition: A | Discharge status: Home / Self Care | Source: Ambulatory Visit | Attending: Family Medicine

## 2024-04-09 VITALS — BP 117/76 | HR 97 | Temp 98.2°F | Resp 18

## 2024-04-09 DIAGNOSIS — S8991XA Unspecified injury of right lower leg, initial encounter: Secondary | ICD-10-CM | POA: Diagnosis not present

## 2024-04-09 DIAGNOSIS — M25561 Pain in right knee: Secondary | ICD-10-CM | POA: Diagnosis not present

## 2024-04-09 NOTE — ED Provider Notes (Signed)
 [facility]    CSN: [id number] Arrival date & time: 04/09/24  1400      History   Chief Complaint Chief Complaint  Patient presents with   Knee Injury    Entered by patient    HPI Kristin Stevenson is a 68 y.o. female.   Pt is a 68 year old female that presents with right knee pain. Pt was blowing leaves and stepped into a hole and twisted right knee 3 weeks ago.  This past Tuesday she was walking up a ramp and heard her right knee pop. Denies swelling, bruising.  Hurts when walking and sitting Pt reports falling on right knee a year ago. Pt has a hx of falls.      Past Medical History:  Diagnosis Date   Arthritis 05/25/2021   Atrophic vaginitis 10/01/2022   Blood in urine 10/01/2022   Coronary artery vasospasm 02/27/2017   Dyslipidemia 01/31/2015   Essential hypertension 01/31/2015   Hypercholesteremia    Hypertension    Menopausal symptom 07/31/2016   Non-STEMI (non-ST elevated myocardial infarction) (HCC) 01/31/2015   Overview:  Cardiac cath 02/19/14 with normal coronary arteriography mild apical hypokinesia EF 60%   Vaginal irritation 10/01/2022    Patient Active Problem List   Diagnosis Date Noted   Atrophic vaginitis 10/01/2022   Blood in urine 10/01/2022   Vaginal irritation 10/01/2022   Arthritis 05/25/2021   Hypercholesteremia    Hypertension    Coronary artery vasospasm 02/27/2017   Menopausal symptom 07/31/2016   Dyslipidemia 01/31/2015   Essential hypertension 01/31/2015   Non-STEMI (non-ST elevated myocardial infarction) (HCC) 01/31/2015    Past Surgical History:  Procedure Laterality Date   CESAREAN SECTION  1981   COLONOSCOPY  05/27/2016   Colonic polyp status post polypectomy. Minimal sigmoid diverticulosis. Highly redundant colon   HYSTEROSCOPY WITH D & C  01/03/2012   Procedure: DILATATION AND CURETTAGE /HYSTEROSCOPY;  Surgeon: Carlin LELON Forbes, MD;  Location: [facility];  Service: Gynecology;  Laterality: N/A;   WITH RESECTION OF FIBROIDS      REPEAT CESAREAN SECTION  1983   TONSILLECTOMY      OB History   No obstetric history on file.      Home Medications    Prior to Admission medications   Medication Sig Start Date End Date Taking? Authorizing Provider  amLODipine  (NORVASC ) 5 MG tablet Take 1 tablet by mouth once daily 05/05/23  Yes Munley, Redell PARAS, MD  aspirin 81 MG tablet Take 81 mg by mouth daily.   Yes [provider]  escitalopram (LEXAPRO) 10 MG tablet Take 10 mg by mouth daily. 03/03/20  Yes [provider]  lisinopril (PRINIVIL,ZESTRIL) 20 MG tablet Take 20 mg by mouth daily.    Yes [provider]  simvastatin (ZOCOR) 20 MG tablet Take 20 mg by mouth every evening.   Yes [provider]  Coenzyme Q10 (CO Q-10) 100 MG CAPS Take 100 mg by mouth daily.    [provider]  diphenhydrAMINE (BENADRYL) 25 mg capsule Take 25 mg by mouth as needed for itching, allergies or sleep.    [provider]  Estradiol (ESTRACE VA) Place vaginally.    [provider]  Multiple Vitamins-Minerals (PRESERVISION AREDS 2+MULTI VIT PO) Take 1 tablet by mouth 2 (two) times daily.    [provider]  nitroGLYCERIN  (NITROSTAT ) 0.4 MG SL tablet Place 1 tablet (0.4 mg total) under the tongue every 5 (five) minutes as needed for chest pain. 03/30/24  Monetta Redell PARAS, MD  Omega-3 Fatty Acids (FISH OIL) 1000 MG CPDR Take 1 capsule by mouth daily.    [provider]  TURMERIC PO Take 1,000 mg by mouth 2 (two) times daily.    [provider]    Family History Family History  Problem Relation Age of Onset   Hypertension Mother    Diabetes Mother    Hypertension Paternal Uncle    Colon cancer Maternal Grandmother    Colon polyps Neg Hx    Esophageal cancer Neg Hx    Stomach cancer Neg Hx    Rectal cancer Neg Hx     Social History Social History   Tobacco Use   Smoking status: Never    Passive exposure: Never    Smokeless tobacco: Never  Vaping Use   Vaping status: Never Used  Substance Use Topics   Alcohol  use: Yes    Comment: occasional   Drug use: No     Allergies   Patient has no known allergies.   Review of Systems Review of Systems See HPI  Physical Exam Triage Vital Signs ED Triage Vitals  Encounter Vitals Group     BP 04/09/24 1422 117/76     Girls Systolic BP Percentile --      Girls Diastolic BP Percentile --      Boys Systolic BP Percentile --      Boys Diastolic BP Percentile --      Pulse Rate 04/09/24 1422 97     Resp 04/09/24 1422 18     Temp 04/09/24 1422 98.2 F (36.8 C)     Temp Source 04/09/24 1422 Oral     SpO2 04/09/24 1422 95 %     Weight --      Height --      Head Circumference --      Peak Flow --      Pain Score 04/09/24 1420 2     Pain Loc --      Pain Education --      Exclude from Growth Chart --    No data found.  Updated Vital Signs BP 117/76 (BP Location: Right Arm)   Pulse 97   Temp 98.2 F (36.8 C) (Oral)   Resp 18   SpO2 95%   Visual Acuity Right Eye Distance:   Left Eye Distance:   Bilateral Distance:    Right Eye Near:   Left Eye Near:    Bilateral Near:     Physical Exam Vitals and nursing note reviewed.  Constitutional:      General: She is not in acute distress.    Appearance: Normal appearance. She is not ill-appearing, toxic-appearing or diaphoretic.  Pulmonary:     Effort: Pulmonary effort is normal.  Musculoskeletal:        General: No swelling or tenderness.     Comments: Generalized right knee tenderness with movement. Nothing specific with palpation. No swelling, bruising, laxity.   Neurological:     Mental Status: She is alert.  Psychiatric:        Mood and Affect: Mood normal.      UC Treatments / Results  Labs (all labs ordered are listed, but only abnormal results are displayed) Labs Reviewed - No data to display  EKG   Radiology DG Knee Complete 4 Views Right Result Date:  04/09/2024 EXAM: 4 OR MORE VIEW(S) Xray of the Right Knee 04/09/2024 02:41:00 PM COMPARISON: None available. CLINICAL HISTORY: Injury, pain FINDINGS: BONES AND JOINTS:  No acute fracture. No malalignment. No significant joint effusion. SOFT TISSUES: The soft tissues are unremarkable. IMPRESSION: 1. No significant abnormality. Electronically signed by: Lynwood Seip MD 04/09/2024 03:04 PM EST RP Workstation: [id number]    Procedures Procedures (including critical care time)  Medications Ordered in UC Medications - No data to display  Initial Impression / Assessment and Plan / UC Course  I have reviewed the triage vital signs and the nursing notes.  Pertinent labs & imaging results that were available during my care of the patient were reviewed by me and considered in my medical decision making (see chart for details).     Right knee pain- xray was normal. Most likely sprained. Recommend knee sleeve for compression. Rest, Ice, Elevate.  Ibuprofen for pain as needed.  See ortho as needed  Final Clinical Impressions(s) / UC Diagnoses   Final diagnoses:  Injury of right knee, initial encounter     Discharge Instructions      Your xray was normal. Most likely sprained. Recommend knee sleeve for compression. Rest, Ice, Elevate.  Ibuprofen for pain as needed.  See ortho as needed     ED Prescriptions   None    PDMP not reviewed this encounter.   Adah Wilbert LABOR, FNP 04/10/24 [phone]

## 2024-04-09 NOTE — Discharge Instructions (Addendum)
 Your xray was normal. Most likely sprained. Recommend knee sleeve for compression. Rest, Ice, Elevate.  Ibuprofen for pain as needed.  See ortho as needed

## 2024-04-09 NOTE — ED Triage Notes (Addendum)
 Pt was blowing leaves and stepped into a hole and twisted right knee 3 weeks ago , this past Tuesday she was walking up a ramp, she heard her right knee pop. Denies swelling, bruising.  Hurts when walking and sitting Pt reports falling on right knee a year ago. Pt has a hx of falls.

## 2024-05-26 ENCOUNTER — Other Ambulatory Visit: Payer: Self-pay | Admitting: Cardiology
